# Patient Record
Sex: Male | Born: 1980 | Race: White | Hispanic: No | Marital: Single | Smoking: Current every day smoker
Health system: Southern US, Community
[De-identification: ages and names within clinical notes are randomized; demographics above are authoritative.]

## PROBLEM LIST (undated history)

## (undated) DIAGNOSIS — R12 Heartburn: Secondary | ICD-10-CM

## (undated) HISTORY — DX: Heartburn: R12

---

## 2006-04-29 ENCOUNTER — Emergency Department: Payer: Self-pay | Admitting: Emergency Medicine

## 2006-05-01 ENCOUNTER — Emergency Department: Payer: Self-pay | Admitting: Internal Medicine

## 2006-05-03 ENCOUNTER — Emergency Department: Payer: Self-pay | Admitting: Emergency Medicine

## 2006-05-12 ENCOUNTER — Emergency Department: Payer: Self-pay | Admitting: Internal Medicine

## 2006-10-17 ENCOUNTER — Ambulatory Visit: Payer: Self-pay | Admitting: Family Medicine

## 2006-10-26 ENCOUNTER — Ambulatory Visit: Payer: Self-pay | Admitting: Family Medicine

## 2007-06-28 ENCOUNTER — Emergency Department: Payer: Self-pay | Admitting: Internal Medicine

## 2015-05-20 ENCOUNTER — Encounter: Payer: Self-pay | Admitting: Family Medicine

## 2015-05-20 ENCOUNTER — Ambulatory Visit (INDEPENDENT_AMBULATORY_CARE_PROVIDER_SITE_OTHER): Payer: Commercial Managed Care - HMO | Admitting: Family Medicine

## 2015-05-20 VITALS — BP 123/79 | HR 75 | Temp 98.2°F | Resp 16 | Ht 70.0 in | Wt 382.0 lb

## 2015-05-20 DIAGNOSIS — F17200 Nicotine dependence, unspecified, uncomplicated: Secondary | ICD-10-CM | POA: Insufficient documentation

## 2015-05-20 DIAGNOSIS — E669 Obesity, unspecified: Secondary | ICD-10-CM | POA: Diagnosis not present

## 2015-05-20 DIAGNOSIS — Z720 Tobacco use: Secondary | ICD-10-CM

## 2015-05-20 DIAGNOSIS — Z7689 Persons encountering health services in other specified circumstances: Secondary | ICD-10-CM | POA: Insufficient documentation

## 2015-05-20 DIAGNOSIS — Z7189 Other specified counseling: Secondary | ICD-10-CM | POA: Diagnosis not present

## 2015-05-20 NOTE — Patient Instructions (Signed)
Long discussion re: stopping smoking and weight loss strategies.

## 2015-05-20 NOTE — Progress Notes (Signed)
Name: Timothy Beck   MRN: [id number]    DOB: 30-Sep-1980   Date:05/20/2015       Progress Note  Subjective  Chief Complaint  Chief Complaint  Patient presents with  . Establish Care    HPI Here to establish care.  Has problem with Obesity and he smokes 1 ppd.  He now wants to stop smoking and to lose weight.  He has started a weight loss and diet and exercise program.  He took Chantix in distant past with minimal success.  No problem-specific assessment & plan notes found for this encounter.   Past Medical History  Diagnosis Date  . Heartburn     History reviewed. No pertinent past surgical history.  Family History  Problem Relation Age of Onset  . Diabetes Mother   . Alcohol abuse Father   . Hypertension Daughter     Social History   Social History  . Marital Status: Single    Spouse Name: N/A  . Number of Children: N/A  . Years of Education: N/A   Occupational History  . Not on file.   Social History Main Topics  . Smoking status: Current Every Day Smoker -- 1.00 packs/day for 12 years    Types: Cigarettes  . Smokeless tobacco: Never Used  . Alcohol Use: 2.4 oz/week    4 Cans of beer per week  . Drug Use: No  . Sexual Activity: Not on file   Other Topics Concern  . Not on file   Social History Narrative  . No narrative on file     Current outpatient prescriptions:  Marland Kitchen  Multiple Vitamin (MULTIVITAMIN) tablet, Take 1 tablet by mouth daily., Disp: , Rfl:   Not on File   Review of Systems  Constitutional: Negative for fever, chills, weight loss and malaise/fatigue.  HENT: Negative for hearing loss.   Eyes: Negative for blurred vision and double vision.  Respiratory: Negative for cough, shortness of breath and wheezing.   Cardiovascular: Negative for chest pain, palpitations and leg swelling.  Gastrointestinal: Negative for heartburn, abdominal pain and blood in stool.  Genitourinary: Negative for dysuria, urgency and frequency.  Musculoskeletal:  Negative for myalgias and joint pain.  Skin: Negative for rash.  Neurological: Negative for dizziness, tremors, weakness and headaches.      Objective  Filed Vitals:   05/20/15 1006  BP: 123/79  Pulse: 75  Temp: 98.2 F (36.8 C)  TempSrc: Oral  Resp: 16  Height:  (1.778 m)  Weight: 382 lb (173.274 kg)    Physical Exam  Constitutional: He is oriented to person, place, and time and well-developed, well-nourished, and in no distress. No distress.  HENT:  Head: Normocephalic and atraumatic.  Eyes: Conjunctivae and EOM are normal. Pupils are equal, round, and reactive to light. No scleral icterus.  Neck: Normal range of motion. Neck supple. No thyromegaly present.  Cardiovascular: Normal rate, regular rhythm and normal heart sounds.  Exam reveals no gallop and no friction rub.   No murmur heard. Pulmonary/Chest: Effort normal and breath sounds normal. No respiratory distress. He has no wheezes. He has no rales.  Abdominal: Soft. Bowel sounds are normal. He exhibits no distension and no mass. There is no tenderness.  Morbidly obese  Musculoskeletal: He exhibits edema (bilateral 1+ pedal edema.).  Lymphadenopathy:    He has no cervical adenopathy.  Neurological: He is alert and oriented to person, place, and time.  Vitals reviewed.      No results found for this  or any previous visit (from the past 2160 hour(s)).   Assessment & Plan  Problem List Items Addressed This Visit      Other   Smoker - Primary   Relevant Orders   PR TOBACCO COUNSELING   Obesity   Relevant Orders   Comprehensive Metabolic Panel (CMET)   Lipid Profile   CBC with Differential   TSH   Encounter to establish care      Meds ordered this encounter  Medications  . Multiple Vitamin (MULTIVITAMIN) tablet    Sig: Take 1 tablet by mouth daily.   1. Encounter to establish care   2. Smoker  - PR TOBACCO COUNSELING  3. Obesity  - Comprehensive Metabolic Panel (CMET) - Lipid  Profile - CBC with Differential - TSH

## 2015-06-26 ENCOUNTER — Ambulatory Visit: Payer: Commercial Managed Care - HMO | Attending: Otolaryngology

## 2015-06-26 DIAGNOSIS — G4733 Obstructive sleep apnea (adult) (pediatric): Secondary | ICD-10-CM | POA: Diagnosis not present

## 2015-06-26 DIAGNOSIS — Z6841 Body Mass Index (BMI) 40.0 and over, adult: Secondary | ICD-10-CM | POA: Diagnosis not present

## 2015-06-27 LAB — COMPREHENSIVE METABOLIC PANEL
A/G RATIO: 1.9 (ref 1.2–2.2)
ALBUMIN: 4.3 g/dL (ref 3.5–5.5)
ALT: 28 IU/L (ref 0–44)
AST: 23 IU/L (ref 0–40)
Alkaline Phosphatase: 85 IU/L (ref 39–117)
BILIRUBIN TOTAL: 0.3 mg/dL (ref 0.0–1.2)
BUN / CREAT RATIO: 16 (ref 9–20)
BUN: 15 mg/dL (ref 6–20)
CHLORIDE: 102 mmol/L (ref 96–106)
CO2: 23 mmol/L (ref 18–29)
Calcium: 9.3 mg/dL (ref 8.7–10.2)
Creatinine, Ser: 0.93 mg/dL (ref 0.76–1.27)
GFR calc non Af Amer: 107 mL/min/{1.73_m2} (ref >59)
GFR, EST AFRICAN AMERICAN: 123 mL/min/{1.73_m2} (ref >59)
GLOBULIN, TOTAL: 2.3 g/dL (ref 1.5–4.5)
Glucose: 111 mg/dL — ABNORMAL HIGH (ref 65–99)
Potassium: 5 mmol/L (ref 3.5–5.2)
SODIUM: 141 mmol/L (ref 134–144)
TOTAL PROTEIN: 6.6 g/dL (ref 6.0–8.5)

## 2015-06-27 LAB — CBC WITH DIFFERENTIAL/PLATELET
BASOS ABS: 0 10*3/uL (ref 0.0–0.2)
Basos: 0 %
EOS (ABSOLUTE): 0.3 10*3/uL (ref 0.0–0.4)
Eos: 4 %
HEMOGLOBIN: 14.5 g/dL (ref 12.6–17.7)
Hematocrit: 40.9 % (ref 37.5–51.0)
Immature Grans (Abs): 0 10*3/uL (ref 0.0–0.1)
Immature Granulocytes: 0 %
LYMPHS ABS: 3 10*3/uL (ref 0.7–3.1)
Lymphs: 37 %
MCH: 29.5 pg (ref 26.6–33.0)
MCHC: 35.5 g/dL (ref 31.5–35.7)
MCV: 83 fL (ref 79–97)
MONOCYTES: 7 %
MONOS ABS: 0.5 10*3/uL (ref 0.1–0.9)
Neutrophils Absolute: 4.4 10*3/uL (ref 1.4–7.0)
Neutrophils: 52 %
Platelets: 236 10*3/uL (ref 150–379)
RBC: 4.91 x10E6/uL (ref 4.14–5.80)
RDW: 13.9 % (ref 12.3–15.4)
WBC: 8.3 10*3/uL (ref 3.4–10.8)

## 2015-06-27 LAB — TSH: TSH: 1.61 u[IU]/mL (ref 0.450–4.500)

## 2015-06-27 LAB — LIPID PANEL
Chol/HDL Ratio: 3.4 ratio units (ref 0.0–5.0)
Cholesterol, Total: 153 mg/dL (ref 100–199)
HDL: 45 mg/dL (ref >39)
LDL Calculated: 94 mg/dL (ref 0–99)
Triglycerides: 71 mg/dL (ref 0–149)
VLDL CHOLESTEROL CAL: 14 mg/dL (ref 5–40)

## 2015-06-30 ENCOUNTER — Ambulatory Visit: Payer: Commercial Managed Care - HMO | Admitting: Family Medicine

## 2015-06-30 ENCOUNTER — Encounter: Payer: Self-pay | Admitting: *Deleted

## 2015-07-02 LAB — HGB A1C W/O EAG: HEMOGLOBIN A1C: 5.7 % — AB (ref 4.8–5.6)

## 2015-07-02 LAB — SPECIMEN STATUS REPORT

## 2015-07-23 ENCOUNTER — Ambulatory Visit (INDEPENDENT_AMBULATORY_CARE_PROVIDER_SITE_OTHER): Payer: Commercial Managed Care - HMO | Admitting: Family Medicine

## 2015-07-23 ENCOUNTER — Encounter: Payer: Self-pay | Admitting: Family Medicine

## 2015-07-23 VITALS — BP 118/76 | HR 72 | Temp 98.6°F | Resp 16 | Ht 70.0 in | Wt 392.0 lb

## 2015-07-23 DIAGNOSIS — F17200 Nicotine dependence, unspecified, uncomplicated: Secondary | ICD-10-CM

## 2015-07-23 DIAGNOSIS — J302 Other seasonal allergic rhinitis: Secondary | ICD-10-CM | POA: Insufficient documentation

## 2015-07-23 DIAGNOSIS — I8311 Varicose veins of right lower extremity with inflammation: Secondary | ICD-10-CM

## 2015-07-23 DIAGNOSIS — R7303 Prediabetes: Secondary | ICD-10-CM

## 2015-07-23 DIAGNOSIS — E669 Obesity, unspecified: Secondary | ICD-10-CM | POA: Diagnosis not present

## 2015-07-23 DIAGNOSIS — Z720 Tobacco use: Secondary | ICD-10-CM

## 2015-07-23 DIAGNOSIS — F429 Obsessive-compulsive disorder, unspecified: Secondary | ICD-10-CM | POA: Insufficient documentation

## 2015-07-23 DIAGNOSIS — R600 Localized edema: Secondary | ICD-10-CM | POA: Insufficient documentation

## 2015-07-23 DIAGNOSIS — I872 Venous insufficiency (chronic) (peripheral): Secondary | ICD-10-CM

## 2015-07-23 DIAGNOSIS — I8312 Varicose veins of left lower extremity with inflammation: Secondary | ICD-10-CM | POA: Diagnosis not present

## 2015-07-23 MED ORDER — TRIAMCINOLONE ACETONIDE 0.1 % EX CREA: 1.0000 "application " | TOPICAL_CREAM | Freq: Two times a day (BID) | CUTANEOUS | Status: DC

## 2015-07-23 MED ORDER — BUPROPION HCL ER (SR) 100 MG PO TB12: 100.0000 mg | ORAL_TABLET | Freq: Two times a day (BID) | ORAL | Status: DC

## 2015-07-23 MED ORDER — FLUTICASONE PROPIONATE 50 MCG/ACT NA SUSP
2.0000 | Freq: Every day | NASAL | Status: DC
Start: 2015-07-23 — End: 2019-04-16

## 2015-07-23 MED ORDER — FUROSEMIDE 20 MG PO TABS: 20.0000 mg | ORAL_TABLET | Freq: Every day | ORAL | Status: DC

## 2015-07-23 NOTE — Progress Notes (Signed)
Name: Timothy Beck   MRN: [id number]    DOB: [birth year]   Date:07/23/2015       Progress Note  Subjective  Chief Complaint  Chief Complaint  Patient presents with  . Nicotine Dependence    patient's smoking     HPI Here for f/u of smoking.  Also has been found to be pre-diabetic with A1c of 5.7.  He has had pedal edema.  He is compulsively eating and smoking. He has also continued to gain weight.  No problem-specific assessment & plan notes found for this encounter.   Past Medical History  Diagnosis Date  . Heartburn     History reviewed. No pertinent past surgical history.  Family History  Problem Relation Age of Onset  . Diabetes Mother   . Alcohol abuse Father   . Hypertension Daughter     Social History   Social History  . Marital Status: Single    Spouse Name: N/A  . Number of Children: N/A  . Years of Education: N/A   Occupational History  . Not on file.   Social History Main Topics  . Smoking status: Current Every Day Smoker -- 1.00 packs/day for 12 years    Types: Cigarettes  . Smokeless tobacco: Never Used  . Alcohol Use: 2.4 oz/week    4 Cans of beer per week  . Drug Use: No  . Sexual Activity: Not on file   Other Topics Concern  . Not on file   Social History Narrative     Current outpatient prescriptions:  .  fluticasone (FLONASE) 50 MCG/ACT nasal spray, Place 2 sprays into both nostrils daily., Disp: 16 g, Rfl: 12 .  Multiple Vitamin (MULTIVITAMIN) tablet, Take 1 tablet by mouth daily., Disp: , Rfl:  .  buPROPion (WELLBUTRIN SR) 100 MG 12 hr tablet, Take 1 tablet (100 mg total) by mouth 2 (two) times daily., Disp: 60 tablet, Rfl: 3 .  furosemide (LASIX) 20 MG tablet, Take 1 tablet (20 mg total) by mouth daily., Disp: 30 tablet, Rfl: 1 .  triamcinolone cream (KENALOG) 0.1 %, Apply 1 application topically 2 (two) times daily., Disp: 60 g, Rfl: 3  Not on File   Review of Systems  Constitutional: Negative for fever, chills, weight loss  and malaise/fatigue.  HENT: Negative for hearing loss.   Eyes: Negative for blurred vision and double vision.  Respiratory: Negative for cough, shortness of breath and wheezing.   Cardiovascular: Positive for leg swelling. Negative for chest pain and palpitations.  Gastrointestinal: Negative for heartburn, abdominal pain and blood in stool.  Genitourinary: Negative for dysuria, urgency and frequency.  Musculoskeletal: Negative for myalgias and joint pain.  Skin: Negative for rash.  Neurological: Negative for dizziness, tremors, weakness and headaches.  Psychiatric/Behavioral:       Compulsive behavior.      Objective  Filed Vitals:   07/23/15 1100  BP: 118/76  Pulse: 72  Temp: 98.6 F (37 C)  TempSrc: Oral  Resp: 16  Height:  (1.778 m)  Weight: 392 lb (177.81 kg)    Physical Exam  Constitutional: He is oriented to person, place, and time and well-developed, well-nourished, and in no distress. No distress.  HENT:  Head: Normocephalic and atraumatic.  Eyes: Conjunctivae and EOM are normal. Pupils are equal, round, and reactive to light. No scleral icterus.  Neck: Normal range of motion. Neck supple. Carotid bruit is not present. No thyromegaly present.  Cardiovascular: Normal rate and regular rhythm.  Exam reveals no gallop  and no friction rub.   No murmur heard. Pulmonary/Chest: Effort normal and breath sounds normal. No respiratory distress. He has no wheezes. He has no rales.  Musculoskeletal: He exhibits edema (1+ pedal edema).  Lymphadenopathy:    He has no cervical adenopathy.  Neurological: He is alert and oriented to person, place, and time.  Skin:  excematous rash of both lower legs  Psychiatric:  No overt depression.  No suicidal ideation  Vitals reviewed.      Recent Results (from the past 2160 hour(s))  Comprehensive Metabolic Panel (CMET)     Status: Abnormal   Collection Time: 06/26/15  9:07 AM  Result Value Ref Range   Glucose 111 (H) 65 -  99 mg/dL   BUN 15 6 - 20 mg/dL   Creatinine, Ser 1.610.93 0.76 - 1.27 mg/dL   GFR calc non Af Amer 107 >59 mL/min/1.73   GFR calc Af Amer 123 >59 mL/min/1.73   BUN/Creatinine Ratio 16 9 - 20   Sodium 141 134 - 144 mmol/L   Potassium 5.0 3.5 - 5.2 mmol/L   Chloride 102 96 - 106 mmol/L   CO2 23 18 - 29 mmol/L   Calcium 9.3 8.7 - 10.2 mg/dL   Total Protein 6.6 6.0 - 8.5 g/dL   Albumin 4.3 3.5 - 5.5 g/dL   Globulin, Total 2.3 1.5 - 4.5 g/dL   Albumin/Globulin Ratio 1.9 1.2 - 2.2   Bilirubin Total 0.3 0.0 - 1.2 mg/dL   Alkaline Phosphatase 85 39 - 117 IU/L   AST 23 0 - 40 IU/L   ALT 28 0 - 44 IU/L  Lipid Profile     Status: None   Collection Time: 06/26/15  9:07 AM  Result Value Ref Range   Cholesterol, Total 153 100 - 199 mg/dL   Triglycerides 71 0 - 149 mg/dL   HDL 45 >09>39 mg/dL   VLDL Cholesterol Cal 14 5 - 40 mg/dL   LDL Calculated 94 0 - 99 mg/dL   Chol/HDL Ratio 3.4 0.0 - 5.0 ratio units    Comment:                                   T. Chol/HDL Ratio                                             Men  Women                               1/2 Avg.Risk  3.4    3.3                                   Avg.Risk  5.0    4.4                                2X Avg.Risk  9.6    7.1                                3X Avg.Risk 23.4   11.0   CBC with Differential  Status: None   Collection Time: 06/26/15  9:07 AM  Result Value Ref Range   WBC 8.3 3.4 - 10.8 x10E3/uL   RBC 4.91 4.14 - 5.80 x10E6/uL   Hemoglobin 14.5 12.6 - 17.7 g/dL   Hematocrit 40.9 81.1 - 51.0 %   MCV 83 79 - 97 fL   MCH 29.5 26.6 - 33.0 pg   MCHC 35.5 31.5 - 35.7 g/dL   RDW 91.4 78.2 - 95.6 %   Platelets 236 150 - 379 x10E3/uL   Neutrophils 52 %   Lymphs 37 %   Monocytes 7 %   Eos 4 %   Basos 0 %   Neutrophils Absolute 4.4 1.4 - 7.0 x10E3/uL   Lymphocytes Absolute 3.0 0.7 - 3.1 x10E3/uL   Monocytes Absolute 0.5 0.1 - 0.9 x10E3/uL   EOS (ABSOLUTE) 0.3 0.0 - 0.4 x10E3/uL   Basophils Absolute 0.0 0.0 - 0.2 x10E3/uL    Immature Granulocytes 0 %   Immature Grans (Abs) 0.0 0.0 - 0.1 x10E3/uL  TSH     Status: None   Collection Time: 06/26/15  9:07 AM  Result Value Ref Range   TSH 1.610 0.450 - 4.500 uIU/mL  Hgb A1c w/o eAG     Status: Abnormal   Collection Time: 06/26/15  9:07 AM  Result Value Ref Range   Hgb A1c MFr Bld 5.7 (H) 4.8 - 5.6 %    Comment:          Pre-diabetes: 5.7 - 6.4          Diabetes: >6.4          Glycemic control for adults with diabetes: <7.0   Specimen status report     Status: None   Collection Time: 06/26/15  9:07 AM  Result Value Ref Range   specimen status report Comment     Comment: Written Authorization Written Authorization Written Authorization Received. Authorization received from [facility] 07-01-2015 Logged by Frances Furbish      Assessment & Plan  Problem List Items Addressed This Visit      Respiratory   Seasonal allergies   Relevant Medications   fluticasone (FLONASE) 50 MCG/ACT nasal spray     Other   Smoker   Obesity   Compulsive disorder   Relevant Medications   buPROPion (WELLBUTRIN SR) 100 MG 12 hr tablet   Pedal edema   Relevant Medications   furosemide (LASIX) 20 MG tablet    Other Visit Diagnoses    Pre-diabetes    -  Primary    Venous stasis dermatitis of both lower extremities        Relevant Medications    triamcinolone cream (KENALOG) 0.1 %       Meds ordered this encounter  Medications  . DISCONTD: fluticasone (FLONASE) 50 MCG/ACT nasal spray    Sig: Place 2 sprays into both nostrils daily.    Refill:  12  . fluticasone (FLONASE) 50 MCG/ACT nasal spray    Sig: Place 2 sprays into both nostrils daily.    Dispense:  16 g    Refill:  12  . triamcinolone cream (KENALOG) 0.1 %    Sig: Apply 1 application topically 2 (two) times daily.    Dispense:  60 g    Refill:  3  . furosemide (LASIX) 20 MG tablet    Sig: Take 1 tablet (20 mg total) by mouth daily.    Dispense:  30 tablet    Refill:  1  . buPROPion  [facility] SR)  100 MG 12 hr tablet    Sig: Take 1 tablet (100 mg total) by mouth 2 (two) times daily.    Dispense:  60 tablet    Refill:  3   1. Pre-diabetes   2. Obesity   3. Smoker   4. Compulsive disorder  - buPROPion (WELLBUTRIN SR) 100 MG 12 hr tablet; Take 1 tablet (100 mg total) by mouth 2 (two) times daily.  Dispense: 60 tablet; Refill: 3  5. Seasonal allergies  - fluticasone (FLONASE) 50 MCG/ACT nasal spray; Place 2 sprays into both nostrils daily.  Dispense: 16 g; Refill: 12  6. Venous stasis dermatitis of both lower extremities  - triamcinolone cream (KENALOG) 0.1 %; Apply 1 application topically 2 (two) times daily.  Dispense: 60 g; Refill: 3  7. Pedal edema  - furosemide (LASIX) 20 MG tablet; Take 1 tablet (20 mg total) by mouth daily.  Dispense: 30 tablet; Refill: 1

## 2015-08-25 ENCOUNTER — Ambulatory Visit (INDEPENDENT_AMBULATORY_CARE_PROVIDER_SITE_OTHER): Payer: Commercial Managed Care - HMO | Admitting: Family Medicine

## 2015-08-25 ENCOUNTER — Encounter: Payer: Self-pay | Admitting: Family Medicine

## 2015-08-25 VITALS — BP 121/72 | HR 76 | Temp 99.0°F | Resp 16 | Ht 70.0 in | Wt 389.0 lb

## 2015-08-25 DIAGNOSIS — R600 Localized edema: Secondary | ICD-10-CM | POA: Diagnosis not present

## 2015-08-25 DIAGNOSIS — F429 Obsessive-compulsive disorder, unspecified: Secondary | ICD-10-CM

## 2015-08-25 DIAGNOSIS — F17200 Nicotine dependence, unspecified, uncomplicated: Secondary | ICD-10-CM

## 2015-08-25 DIAGNOSIS — Z720 Tobacco use: Secondary | ICD-10-CM

## 2015-08-25 DIAGNOSIS — E669 Obesity, unspecified: Secondary | ICD-10-CM

## 2015-08-25 LAB — BASIC METABOLIC PANEL WITH GFR
BUN: 16 mg/dL (ref 7–25)
CALCIUM: 9.4 mg/dL (ref 8.6–10.3)
CO2: 27 mmol/L (ref 20–31)
CREATININE: 1.01 mg/dL (ref 0.60–1.35)
Chloride: 103 mmol/L (ref 98–110)
GFR, Est Non African American: >89 mL/min (ref >=60)
GLUCOSE: 99 mg/dL (ref 65–99)
Potassium: 4.6 mmol/L (ref 3.5–5.3)
Sodium: 137 mmol/L (ref 135–146)

## 2015-08-25 MED ORDER — FUROSEMIDE 20 MG PO TABS: 20.0000 mg | ORAL_TABLET | Freq: Every day | ORAL | 6 refills | Status: DC

## 2015-08-25 MED ORDER — BUPROPION HCL ER (SR) 150 MG PO TB12: 150.0000 mg | ORAL_TABLET | Freq: Two times a day (BID) | ORAL | 6 refills | Status: DC

## 2015-08-25 NOTE — Progress Notes (Signed)
Name: Timothy Beck   MRN: [id number]    DOB: 27-Oct-1980   Date:08/25/2015       Progress Note  Subjective  Chief Complaint  Chief Complaint  Patient presents with  . Edema    f/u new start lasix    HPI  Here for f/u of Obesity, Cigarette smoking and Pedal edema.  Still smoking, but only 1 ppd instead of 2.Marland Kitchen  He is noticing some improvement of edema, but takes it intermittantly sometimes. HE is still not eating properly yet. No problem-specific Assessment & Plan notes found for this encounter.   Past Medical History:  Diagnosis Date  . Heartburn     History reviewed. No pertinent surgical history.  Family History  Problem Relation Age of Onset  . Diabetes Mother   . Alcohol abuse Father   . Hypertension Daughter     Social History   Social History  . Marital status: Single    Spouse name: N/A  . Number of children: N/A  . Years of education: N/A   Occupational History  . Not on file.   Social History Main Topics  . Smoking status: Current Every Day Smoker    Packs/day: 1.00    Years: 12.00    Types: Cigarettes  . Smokeless tobacco: Never Used  . Alcohol use 2.4 oz/week    4 Cans of beer per week  . Drug use: No  . Sexual activity: Not on file   Other Topics Concern  . Not on file   Social History Narrative  . No narrative on file     Current Outpatient Prescriptions:  .  buPROPion (WELLBUTRIN SR) 150 MG 12 hr tablet, Take 1 tablet (150 mg total) by mouth 2 (two) times daily., Disp: 60 tablet, Rfl: 6 .  fluticasone (FLONASE) 50 MCG/ACT nasal spray, Place 2 sprays into both nostrils daily., Disp: 16 g, Rfl: 12 .  furosemide (LASIX) 20 MG tablet, Take 1 tablet (20 mg total) by mouth daily., Disp: 30 tablet, Rfl: 6 .  Multiple Vitamin (MULTIVITAMIN) tablet, Take 1 tablet by mouth daily., Disp: , Rfl:  .  triamcinolone cream (KENALOG) 0.1 %, Apply 1 application topically 2 (two) times daily., Disp: 60 g, Rfl: 3  No Known Allergies   Review of Systems   Constitutional: Negative for chills, fever, malaise/fatigue and weight loss.  HENT: Negative for hearing loss.   Eyes: Negative for blurred vision and double vision.  Respiratory: Negative for cough, shortness of breath and wheezing.   Cardiovascular: Negative for chest pain, palpitations and leg swelling.  Gastrointestinal: Negative for abdominal pain, blood in stool and heartburn.  Genitourinary: Negative for dysuria, frequency and urgency.  Musculoskeletal: Negative for back pain and myalgias.  Neurological: Negative for dizziness, tremors, weakness and headaches.      Objective  Vitals:   08/25/15 0934  BP: 121/72  Pulse: 76  Resp: 16  Temp: 99 F (37.2 C)  TempSrc: Oral  Weight: (!) 389 lb (176.4 kg)  Height: 5\' 10"  (1.778 m)    Physical Exam  Constitutional: He is oriented to person, place, and time. No distress.  HENT:  Head: Normocephalic and atraumatic.  Eyes: Conjunctivae and EOM are normal. Pupils are equal, round, and reactive to light. No scleral icterus.  Neck: Normal range of motion. Neck supple. Carotid bruit is not present. No thyromegaly present.  Cardiovascular: Normal rate, regular rhythm and normal heart sounds.  Exam reveals no gallop and no friction rub.   No murmur heard.  Pulmonary/Chest: Effort normal and breath sounds normal. No respiratory distress. He has no wheezes. He has no rales.  Abdominal: Soft. Bowel sounds are normal. He exhibits no distension and no mass. There is no tenderness.  Musculoskeletal: He exhibits edema (1+ bilateral pedal edema).  Lymphadenopathy:    He has no cervical adenopathy.  Neurological: He is alert and oriented to person, place, and time.  Vitals reviewed.      Recent Results (from the past 2160 hour(s))  Comprehensive Metabolic Panel (CMET)     Status: Abnormal   Collection Time: 06/26/15  9:07 AM  Result Value Ref Range   Glucose 111 (H) 65 - 99 mg/dL   BUN 15 6 - 20 mg/dL   Creatinine, Ser 0.98 0.76 -  1.27 mg/dL   GFR calc non Af Amer 107 >59 mL/min/1.73   GFR calc Af Amer 123 >59 mL/min/1.73   BUN/Creatinine Ratio 16 9 - 20   Sodium 141 134 - 144 mmol/L   Potassium 5.0 3.5 - 5.2 mmol/L   Chloride 102 96 - 106 mmol/L   CO2 23 18 - 29 mmol/L   Calcium 9.3 8.7 - 10.2 mg/dL   Total Protein 6.6 6.0 - 8.5 g/dL   Albumin 4.3 3.5 - 5.5 g/dL   Globulin, Total 2.3 1.5 - 4.5 g/dL   Albumin/Globulin Ratio 1.9 1.2 - 2.2   Bilirubin Total 0.3 0.0 - 1.2 mg/dL   Alkaline Phosphatase 85 39 - 117 IU/L   AST 23 0 - 40 IU/L   ALT 28 0 - 44 IU/L  Lipid Profile     Status: None   Collection Time: 06/26/15  9:07 AM  Result Value Ref Range   Cholesterol, Total 153 100 - 199 mg/dL   Triglycerides 71 0 - 149 mg/dL   HDL 45 >11 mg/dL   VLDL Cholesterol Cal 14 5 - 40 mg/dL   LDL Calculated 94 0 - 99 mg/dL   Chol/HDL Ratio 3.4 0.0 - 5.0 ratio units    Comment:                                   T. Chol/HDL Ratio                                             Men  Women                               1/2 Avg.Risk  3.4    3.3                                   Avg.Risk  5.0    4.4                                2X Avg.Risk  9.6    7.1                                3X Avg.Risk 23.4   11.0   CBC with Differential     Status: None   Collection Time:  06/26/15  9:07 AM  Result Value Ref Range   WBC 8.3 3.4 - 10.8 x10E3/uL   RBC 4.91 4.14 - 5.80 x10E6/uL   Hemoglobin 14.5 12.6 - 17.7 g/dL   Hematocrit 02.2 33.6 - 51.0 %   MCV 83 79 - 97 fL   MCH 29.5 26.6 - 33.0 pg   MCHC 35.5 31.5 - 35.7 g/dL   RDW 12.2 44.9 - 75.3 %   Platelets 236 150 - 379 x10E3/uL   Neutrophils 52 %   Lymphs 37 %   Monocytes 7 %   Eos 4 %   Basos 0 %   Neutrophils Absolute 4.4 1.4 - 7.0 x10E3/uL   Lymphocytes Absolute 3.0 0.7 - 3.1 x10E3/uL   Monocytes Absolute 0.5 0.1 - 0.9 x10E3/uL   EOS (ABSOLUTE) 0.3 0.0 - 0.4 x10E3/uL   Basophils Absolute 0.0 0.0 - 0.2 x10E3/uL   Immature Granulocytes 0 %   Immature Grans (Abs) 0.0 0.0 -  0.1 x10E3/uL  TSH     Status: None   Collection Time: 06/26/15  9:07 AM  Result Value Ref Range   TSH 1.610 0.450 - 4.500 uIU/mL  Hgb A1c w/o eAG     Status: Abnormal   Collection Time: 06/26/15  9:07 AM  Result Value Ref Range   Hgb A1c MFr Bld 5.7 (H) 4.8 - 5.6 %    Comment:          Pre-diabetes: 5.7 - 6.4          Diabetes: >6.4          Glycemic control for adults with diabetes: <7.0   Specimen status report     Status: None   Collection Time: 06/26/15  9:07 AM  Result Value Ref Range   specimen status report Comment     Comment: Written Authorization Written Authorization Written Authorization Received. Authorization received from [facility] 07-01-2015 Logged by Frances Furbish      Assessment & Plan  Problem List Items Addressed This Visit      Other   Smoker   Obesity   Compulsive disorder - Primary   Relevant Medications   buPROPion (WELLBUTRIN SR) 150 MG 12 hr tablet   Pedal edema   Relevant Medications   furosemide (LASIX) 20 MG tablet   Other Relevant Orders   BASIC METABOLIC PANEL WITH GFR    Other Visit Diagnoses   None.     Meds ordered this encounter  Medications  . buPROPion (WELLBUTRIN SR) 150 MG 12 hr tablet    Sig: Take 1 tablet (150 mg total) by mouth 2 (two) times daily.    Dispense:  60 tablet    Refill:  6  . furosemide (LASIX) 20 MG tablet    Sig: Take 1 tablet (20 mg total) by mouth daily.    Dispense:  30 tablet    Refill:  6   1. Compulsive disorder  - buPROPion (WELLBUTRIN SR) 150 MG 12 hr tablet; Take 1 tablet (150 mg total) by mouth 2 (two) times daily.  Dispense: 60 tablet; Refill: 6  2. Obesity  Suggest gym activity 3. Pedal edema  - BASIC METABOLIC PANEL WITH GFR - furosemide (LASIX) 20 MG tablet; Take 1 tablet (20 mg total) by mouth daily.  Dispense: 30 tablet; Refill: 6  4. Smoker Discussed cutting back more.

## 2015-09-23 ENCOUNTER — Ambulatory Visit (INDEPENDENT_AMBULATORY_CARE_PROVIDER_SITE_OTHER): Payer: Commercial Managed Care - HMO | Admitting: Family Medicine

## 2015-09-23 ENCOUNTER — Encounter: Payer: Self-pay | Admitting: Family Medicine

## 2015-09-23 VITALS — BP 118/76 | HR 71 | Temp 98.3°F | Resp 16 | Ht 70.0 in | Wt 385.0 lb

## 2015-09-23 DIAGNOSIS — E669 Obesity, unspecified: Secondary | ICD-10-CM | POA: Diagnosis not present

## 2015-09-23 DIAGNOSIS — Z720 Tobacco use: Secondary | ICD-10-CM

## 2015-09-23 DIAGNOSIS — J302 Other seasonal allergic rhinitis: Secondary | ICD-10-CM | POA: Diagnosis not present

## 2015-09-23 DIAGNOSIS — F17200 Nicotine dependence, unspecified, uncomplicated: Secondary | ICD-10-CM

## 2015-09-23 NOTE — Progress Notes (Signed)
Name: Timothy ClaymanJohn Wertheim   MRN: [id number]    DOB: 03/24/1980   Date:09/23/2015       Progress Note  Subjective  Chief Complaint  Chief Complaint  Patient presents with  . Obesity    HPI Here for f/u of morbid obesity.  He has worked on eating better and going to the gym regularly.  He is more motivated and overall happy with his efforts so far.  He is still smoking about 1 ppd (a little less).  Hed is still  Not motivated to stop smoking yet.  No problem-specific Assessment & Plan notes found for this encounter.   Past Medical History:  Diagnosis Date  . Heartburn     History reviewed. No pertinent surgical history.  Family History  Problem Relation Age of Onset  . Diabetes Mother   . Alcohol abuse Father   . Hypertension Daughter     Social History   Social History  . Marital status: Single    Spouse name: N/A  . Number of children: N/A  . Years of education: N/A   Occupational History  . Not on file.   Social History Main Topics  . Smoking status: Current Every Day Smoker    Packs/day: 1.00    Years: 12.00    Types: Cigarettes  . Smokeless tobacco: Never Used  . Alcohol use 2.4 oz/week    4 Cans of beer per week  . Drug use: No  . Sexual activity: Not on file   Other Topics Concern  . Not on file   Social History Narrative  . No narrative on file     Current Outpatient Prescriptions:  .  buPROPion (WELLBUTRIN SR) 150 MG 12 hr tablet, Take 1 tablet (150 mg total) by mouth 2 (two) times daily., Disp: 60 tablet, Rfl: 6 .  fluticasone (FLONASE) 50 MCG/ACT nasal spray, Place 2 sprays into both nostrils daily., Disp: 16 g, Rfl: 12 .  furosemide (LASIX) 20 MG tablet, Take 1 tablet (20 mg total) by mouth daily., Disp: 30 tablet, Rfl: 6 .  Multiple Vitamin (MULTIVITAMIN) tablet, Take 1 tablet by mouth daily., Disp: , Rfl:  .  triamcinolone cream (KENALOG) 0.1 %, Apply 1 application topically 2 (two) times daily., Disp: 60 g, Rfl: 3  Not on File   Review of  Systems  Constitutional: Positive for weight loss (desired). Negative for chills, fever and malaise/fatigue.  HENT: Negative for hearing loss.   Eyes: Negative for blurred vision and double vision.  Respiratory: Negative for cough, shortness of breath and wheezing.   Cardiovascular: Negative for chest pain, palpitations and leg swelling.  Gastrointestinal: Negative for abdominal pain, blood in stool and heartburn.  Genitourinary: Negative for dysuria, frequency and urgency.  Musculoskeletal: Negative for joint pain and myalgias.  Neurological: Negative for dizziness, tremors, weakness and headaches.      Objective  Vitals:   09/23/15 0929  BP: 118/76  Pulse: 71  Resp: 16  Temp: 98.3 F (36.8 C)  TempSrc: Oral  Weight: (!) 385 lb (174.6 kg)  Height: 5\' 10"  (1.778 m)    Physical Exam  Constitutional: He is oriented to person, place, and time and well-developed, well-nourished, and in no distress. No distress.  HENT:  Head: Normocephalic and atraumatic.  Neck: Normal range of motion. Neck supple. Carotid bruit is not present. No thyromegaly present.  Cardiovascular: Normal rate, regular rhythm and normal heart sounds.  Exam reveals no gallop and no friction rub.   No murmur heard. Pulmonary/Chest:  Effort normal and breath sounds normal. No respiratory distress. He has no wheezes. He has no rales.  Abdominal: Soft. Bowel sounds are normal. He exhibits no distension and no mass. There is no tenderness.  Musculoskeletal: He exhibits no edema.  Lymphadenopathy:    He has no cervical adenopathy.  Neurological: He is alert and oriented to person, place, and time.  Vitals reviewed.      Recent Results (from the past 2160 hour(s))  Comprehensive Metabolic Panel (CMET)     Status: Abnormal   Collection Time: 06/26/15  9:07 AM  Result Value Ref Range   Glucose 111 (H) 65 - 99 mg/dL   BUN 15 6 - 20 mg/dL   Creatinine, Ser 1.61 0.76 - 1.27 mg/dL   GFR calc non Af Amer 107 >59  mL/min/1.73   GFR calc Af Amer 123 >59 mL/min/1.73   BUN/Creatinine Ratio 16 9 - 20   Sodium 141 134 - 144 mmol/L   Potassium 5.0 3.5 - 5.2 mmol/L   Chloride 102 96 - 106 mmol/L   CO2 23 18 - 29 mmol/L   Calcium 9.3 8.7 - 10.2 mg/dL   Total Protein 6.6 6.0 - 8.5 g/dL   Albumin 4.3 3.5 - 5.5 g/dL   Globulin, Total 2.3 1.5 - 4.5 g/dL   Albumin/Globulin Ratio 1.9 1.2 - 2.2   Bilirubin Total 0.3 0.0 - 1.2 mg/dL   Alkaline Phosphatase 85 39 - 117 IU/L   AST 23 0 - 40 IU/L   ALT 28 0 - 44 IU/L  Lipid Profile     Status: None   Collection Time: 06/26/15  9:07 AM  Result Value Ref Range   Cholesterol, Total 153 100 - 199 mg/dL   Triglycerides 71 0 - 149 mg/dL   HDL 45 >09 mg/dL   VLDL Cholesterol Cal 14 5 - 40 mg/dL   LDL Calculated 94 0 - 99 mg/dL   Chol/HDL Ratio 3.4 0.0 - 5.0 ratio units    Comment:                                   T. Chol/HDL Ratio                                             Men  Women                               1/2 Avg.Risk  3.4    3.3                                   Avg.Risk  5.0    4.4                                2X Avg.Risk  9.6    7.1                                3X Avg.Risk 23.4   11.0   CBC with Differential     Status: None   Collection Time: 06/26/15  9:07 AM  Result Value Ref Range   WBC 8.3 3.4 - 10.8 x10E3/uL   RBC 4.91 4.14 - 5.80 x10E6/uL   Hemoglobin 14.5 12.6 - 17.7 g/dL   Hematocrit 69.6 29.5 - 51.0 %   MCV 83 79 - 97 fL   MCH 29.5 26.6 - 33.0 pg   MCHC 35.5 31.5 - 35.7 g/dL   RDW 28.4 13.2 - 44.0 %   Platelets 236 150 - 379 x10E3/uL   Neutrophils 52 %   Lymphs 37 %   Monocytes 7 %   Eos 4 %   Basos 0 %   Neutrophils Absolute 4.4 1.4 - 7.0 x10E3/uL   Lymphocytes Absolute 3.0 0.7 - 3.1 x10E3/uL   Monocytes Absolute 0.5 0.1 - 0.9 x10E3/uL   EOS (ABSOLUTE) 0.3 0.0 - 0.4 x10E3/uL   Basophils Absolute 0.0 0.0 - 0.2 x10E3/uL   Immature Granulocytes 0 %   Immature Grans (Abs) 0.0 0.0 - 0.1 x10E3/uL  TSH     Status: None    Collection Time: 06/26/15  9:07 AM  Result Value Ref Range   TSH 1.610 0.450 - 4.500 uIU/mL  Hgb A1c w/o eAG     Status: Abnormal   Collection Time: 06/26/15  9:07 AM  Result Value Ref Range   Hgb A1c MFr Bld 5.7 (H) 4.8 - 5.6 %    Comment:          Pre-diabetes: 5.7 - 6.4          Diabetes: >6.4          Glycemic control for adults with diabetes: <7.0   Specimen status report     Status: None   Collection Time: 06/26/15  9:07 AM  Result Value Ref Range   specimen status report Comment     Comment: Written Authorization Written Authorization Written Authorization Received. Authorization received from [facility] 07-01-2015 Logged by Frances Furbish   BASIC METABOLIC PANEL WITH GFR     Status: None   Collection Time: 08/25/15 11:28 AM  Result Value Ref Range   Sodium 137 135 - 146 mmol/L   Potassium 4.6 3.5 - 5.3 mmol/L   Chloride 103 98 - 110 mmol/L   CO2 27 20 - 31 mmol/L   Glucose, Bld 99 65 - 99 mg/dL   BUN 16 7 - 25 mg/dL   Creat 1.02 7.25 - 3.66 mg/dL   Calcium 9.4 8.6 - 44.0 mg/dL   GFR, Est African American >89 >=60 mL/min   GFR, Est Non African American >89 >=60 mL/min     Assessment & Plan  Problem List Items Addressed This Visit      Respiratory   Seasonal allergies     Other   Smoker   Obesity - Primary    Other Visit Diagnoses   None.     No orders of the defined types were placed in this encounter.  1. Obesity Cont. Diet and exercise  2. Smoker Discussed ways to stop smoking agin  3. Seasonal allergies

## 2015-11-25 ENCOUNTER — Ambulatory Visit: Payer: Commercial Managed Care - HMO | Admitting: Family Medicine

## 2015-12-02 ENCOUNTER — Encounter: Payer: Self-pay | Admitting: Family Medicine

## 2015-12-02 ENCOUNTER — Ambulatory Visit (INDEPENDENT_AMBULATORY_CARE_PROVIDER_SITE_OTHER): Payer: Commercial Managed Care - HMO | Admitting: Family Medicine

## 2015-12-02 VITALS — BP 120/80 | HR 78 | Temp 97.8°F | Resp 16 | Ht 70.0 in | Wt 378.0 lb

## 2015-12-02 DIAGNOSIS — Z23 Encounter for immunization: Secondary | ICD-10-CM

## 2015-12-02 DIAGNOSIS — F17200 Nicotine dependence, unspecified, uncomplicated: Secondary | ICD-10-CM | POA: Diagnosis not present

## 2015-12-02 DIAGNOSIS — IMO0001 Reserved for inherently not codable concepts without codable children: Secondary | ICD-10-CM

## 2015-12-02 DIAGNOSIS — E6609 Other obesity due to excess calories: Secondary | ICD-10-CM

## 2015-12-02 NOTE — Progress Notes (Signed)
Name: Timothy Beck   MRN: [id number]    DOB: 02/19/1980   Date:12/02/2015       Progress Note  Subjective  Chief Complaint  Chief Complaint  Patient presents with  . Follow-up  . Nicotine Dependence  . Obesity    HPI Here for f/u of obesity and smoking cessation.  He has lost a little weight.   He is still smoking.  He has gone back to smoking 1ppd.  He states that he will stop cold [place] on Jan. 1, 2018.  No problem-specific Assessment & Plan notes found for this encounter.   Past Medical History:  Diagnosis Date  . Heartburn     History reviewed. No pertinent surgical history.  Family History  Problem Relation Age of Onset  . Diabetes Mother   . Alcohol abuse Father   . Hypertension Daughter     Social History   Social History  . Marital status: Single    Spouse name: N/A  . Number of children: N/A  . Years of education: N/A   Occupational History  . Not on file.   Social History Main Topics  . Smoking status: Current Every Day Smoker    Packs/day: 1.00    Years: 12.00    Types: Cigarettes  . Smokeless tobacco: Never Used  . Alcohol use 2.4 oz/week    4 Cans of beer per week  . Drug use: No  . Sexual activity: Not on file   Other Topics Concern  . Not on file   Social History Narrative  . No narrative on file     Current Outpatient Prescriptions:  .  buPROPion (WELLBUTRIN SR) 150 MG 12 hr tablet, Take 1 tablet (150 mg total) by mouth 2 (two) times daily., Disp: 60 tablet, Rfl: 6 .  fluticasone (FLONASE) 50 MCG/ACT nasal spray, Place 2 sprays into both nostrils daily., Disp: 16 g, Rfl: 12 .  furosemide (LASIX) 20 MG tablet, Take 1 tablet (20 mg total) by mouth daily., Disp: 30 tablet, Rfl: 6 .  Multiple Vitamin (MULTIVITAMIN) tablet, Take 1 tablet by mouth daily as needed. , Disp: , Rfl:  .  triamcinolone cream (KENALOG) 0.1 %, Apply 1 application topically 2 (two) times daily. (Patient taking differently: Apply 1 application topically 2 (two)  times daily as needed. ), Disp: 60 g, Rfl: 3  Not on File   Review of Systems  Constitutional: Negative for chills, fever, malaise/fatigue and weight loss.  HENT: Negative for hearing loss.   Eyes: Negative for blurred vision and double vision.  Respiratory: Negative for cough, shortness of breath and wheezing.   Cardiovascular: Negative for chest pain, palpitations and leg swelling.  Gastrointestinal: Negative for abdominal pain, blood in stool and heartburn.  Genitourinary: Negative for dysuria, frequency and urgency.  Musculoskeletal: Negative for joint pain and myalgias.  Skin: Negative for rash.  Neurological: Negative for dizziness, tingling, tremors, weakness and headaches.      Objective  Vitals:   12/02/15 1026  BP: 120/80  Pulse: 78  Resp: 16  Temp: 97.8 F (36.6 C)  TempSrc: Oral  Weight: (!) 171.5 kg (378 lb)  Height: 5\' 10"  (1.778 m)    Physical Exam  Constitutional: He is oriented to person, place, and time and well-developed, well-nourished, and in no distress. No distress.  HENT:  Head: Normocephalic and atraumatic.  Eyes: Conjunctivae and EOM are normal. Pupils are equal, round, and reactive to light. No scleral icterus.  Neck: Normal range of motion. Neck supple.  Carotid bruit is not present. No thyromegaly present.  Cardiovascular: Normal rate, regular rhythm and normal heart sounds.  Exam reveals no gallop and no friction rub.   No murmur heard. Pulmonary/Chest: Effort normal and breath sounds normal. No respiratory distress. He has no wheezes. He has no rales.  Abdominal:  obese  Musculoskeletal: He exhibits edema (trace bilateral pedal edema.).  Lymphadenopathy:    He has no cervical adenopathy.  Neurological: He is alert and oriented to person, place, and time.  Vitals reviewed.      No results found for this or any previous visit (from the past 2160 hour(s)).   Assessment & Plan  Problem List Items Addressed This Visit      Other    Smoker   Obesity - Primary    Other Visit Diagnoses    Immunization due       Relevant Orders   Flu Vaccine QUAD 36+ mos IM (Fluarix & Fluzone Quad PF      No orders of the defined types were placed in this encounter. 1. Class 3 obesity due to excess calories without serious comorbidity in adult, unspecified BMI Discussed further weight loss 2. Smoker He plans to quit 01/26/16.  3. Immunization due  - Flu Vaccine QUAD 36+ mos IM (Fluarix & Fluzone Quad PF

## 2016-10-01 ENCOUNTER — Encounter: Payer: 59 | Admitting: Family Medicine

## 2016-12-02 ENCOUNTER — Other Ambulatory Visit: Payer: Self-pay | Admitting: Family

## 2017-05-29 ENCOUNTER — Encounter: Payer: Self-pay | Admitting: Emergency Medicine

## 2017-05-29 ENCOUNTER — Emergency Department: Payer: BLUE CROSS/BLUE SHIELD

## 2017-05-29 ENCOUNTER — Other Ambulatory Visit: Payer: Self-pay

## 2017-05-29 ENCOUNTER — Emergency Department
Admission: EM | Admit: 2017-05-29 | Discharge: 2017-05-29 | Disposition: A | Discharge status: Home / Self Care | Payer: BLUE CROSS/BLUE SHIELD | Attending: Emergency Medicine | Admitting: Emergency Medicine

## 2017-05-29 DIAGNOSIS — R070 Pain in throat: Secondary | ICD-10-CM | POA: Diagnosis present

## 2017-05-29 DIAGNOSIS — G4730 Sleep apnea, unspecified: Secondary | ICD-10-CM | POA: Insufficient documentation

## 2017-05-29 DIAGNOSIS — F17210 Nicotine dependence, cigarettes, uncomplicated: Secondary | ICD-10-CM | POA: Diagnosis not present

## 2017-05-29 DIAGNOSIS — Z79899 Other long term (current) drug therapy: Secondary | ICD-10-CM | POA: Insufficient documentation

## 2017-05-29 DIAGNOSIS — J039 Acute tonsillitis, unspecified: Secondary | ICD-10-CM | POA: Insufficient documentation

## 2017-05-29 DIAGNOSIS — R509 Fever, unspecified: Secondary | ICD-10-CM | POA: Insufficient documentation

## 2017-05-29 LAB — CBC WITH DIFFERENTIAL/PLATELET
Basophils Absolute: 0 10*3/uL (ref 0–0.1)
Basophils Relative: 0 %
EOS PCT: 0 %
Eosinophils Absolute: 0 10*3/uL (ref 0–0.7)
HCT: 39.8 % — ABNORMAL LOW (ref 40.0–52.0)
Hemoglobin: 13.9 g/dL (ref 13.0–18.0)
LYMPHS ABS: 1.4 10*3/uL (ref 1.0–3.6)
LYMPHS PCT: 16 %
MCH: 29.1 pg (ref 26.0–34.0)
MCHC: 35 g/dL (ref 32.0–36.0)
MCV: 83.4 fL (ref 80.0–100.0)
MONOS PCT: 10 %
Monocytes Absolute: 0.8 10*3/uL (ref 0.2–1.0)
Neutro Abs: 6.5 10*3/uL (ref 1.4–6.5)
Neutrophils Relative %: 74 %
PLATELETS: 202 10*3/uL (ref 150–440)
RBC: 4.78 MIL/uL (ref 4.40–5.90)
RDW: 15 % — ABNORMAL HIGH (ref 11.5–14.5)
WBC: 8.8 10*3/uL (ref 3.8–10.6)

## 2017-05-29 LAB — COMPREHENSIVE METABOLIC PANEL
ALBUMIN: 4 g/dL (ref 3.5–5.0)
ALT: 27 U/L (ref 17–63)
AST: 28 U/L (ref 15–41)
Alkaline Phosphatase: 64 U/L (ref 38–126)
Anion gap: 10 (ref 5–15)
BUN: 16 mg/dL (ref 6–20)
CHLORIDE: 99 mmol/L — AB (ref 101–111)
CO2: 25 mmol/L (ref 22–32)
Calcium: 8.6 mg/dL — ABNORMAL LOW (ref 8.9–10.3)
Creatinine, Ser: 0.98 mg/dL (ref 0.61–1.24)
GFR calc Af Amer: >60 mL/min (ref >60)
GFR calc non Af Amer: >60 mL/min (ref >60)
GLUCOSE: 104 mg/dL — AB (ref 65–99)
POTASSIUM: 4 mmol/L (ref 3.5–5.1)
SODIUM: 134 mmol/L — AB (ref 135–145)
Total Bilirubin: 0.7 mg/dL (ref 0.3–1.2)
Total Protein: 7.7 g/dL (ref 6.5–8.1)

## 2017-05-29 LAB — GROUP A STREP BY PCR: Group A Strep by PCR: NOT DETECTED

## 2017-05-29 MED ORDER — IOPAMIDOL (ISOVUE-370) INJECTION 76%
75.0000 mL | Freq: Once | INTRAVENOUS | Status: AC | PRN
  Administered 2017-05-29: 75 mL via INTRAVENOUS
  Filled 2017-05-29: qty 75

## 2017-05-29 MED ORDER — AMOXICILLIN-POT CLAVULANATE 875-125 MG PO TABS: 1.0000 | ORAL_TABLET | Freq: Two times a day (BID) | ORAL | 0 refills | Status: AC

## 2017-05-29 MED ORDER — DEXAMETHASONE SODIUM PHOSPHATE 10 MG/ML IJ SOLN
10.0000 mg | Freq: Once | INTRAMUSCULAR | Status: AC
  Administered 2017-05-29: 10 mg via INTRAVENOUS
  Filled 2017-05-29: qty 1

## 2017-05-29 MED ORDER — SODIUM CHLORIDE 0.9 % IV SOLN
3.0000 g | Freq: Once | INTRAVENOUS | Status: AC
  Administered 2017-05-29: 3 g via INTRAVENOUS
  Filled 2017-05-29: qty 3

## 2017-05-29 MED ORDER — PREDNISONE 10 MG PO TABS: ORAL_TABLET | ORAL | 0 refills | Status: DC

## 2017-05-29 NOTE — ED Notes (Signed)
ED Provider at bedside. 

## 2017-05-29 NOTE — ED Triage Notes (Signed)
Pt here for possible PTA from [facility].  Difficult to assess throat.  Uvula appears swollen but not deviated.  Redness noted. Tonsils enlarged. Pt denies difficulty swallowing and reports can swallow secretions but does not want to because it hurts. Denies any difficulty breathing.  Pt states "I only spit sometimes because it hurts to swallow not because I physically can't, they didn't even talk to me just brought me over".

## 2017-05-29 NOTE — Discharge Instructions (Signed)
As we discussed, your lab work and CT scan were reassuring.  There is no drainable infection right now but you do need to be on antibiotics and steroids.  Please continue using your CPAP.  Stay hydrated with soft foods and liquids.  Take over-the-counter pain medicine such as ibuprofen and Tylenol according to label instructions as needed.  Return immediately to the emergency department if any of your symptoms start to get worse or you have more difficulty breathing or swallowing.

## 2017-05-29 NOTE — ED Notes (Signed)
Patient transported to CT 

## 2017-05-29 NOTE — ED Provider Notes (Signed)
[facility] Emergency Department Provider Note  ____________________________________________   First MD Initiated Contact with Patient 05/29/17 1729     (approximate)  I have reviewed the triage vital signs and the nursing notes.   HISTORY  Chief Complaint Sore Throat    HPI Timothy Beck is a 37 y.o. male with no contributory medical history who presents at the strong recommendation of the urgent care for evaluation of his severe sore throat which is been gradually worsening over the last 3 days.  Nothing particular makes it better.  It feels fine as long as he is not speaking or swallowing, but he has severe pain that is worse on the left side when he does try to swallow.  He is not drooling and he is handling his secretions but he does not want to eat or drink due to the pain.  He has an extremely muffled "hot potato" voice that is atypical for him.  He has no trouble breathing.  He had a fever yesterday but has not felt one today.  He denies nausea, vomiting, chest pain, shortness of breath, abdominal pain, and dysuria.  He has not had symptoms like this before.  They are severe, and gradual in onset.  Past Medical History:  Diagnosis Date  . Heartburn     Patient Active Problem List   Diagnosis Date Noted  . Compulsive disorder 07/23/2015  . Seasonal allergies 07/23/2015  . Pedal edema 07/23/2015  . Smoker 05/20/2015  . Obesity 05/20/2015  . Encounter to establish care 05/20/2015    History reviewed. No pertinent surgical history.  Prior to Admission medications   Medication Sig Start Date End Date Taking? Authorizing Provider  amoxicillin-clavulanate (AUGMENTIN) 875-125 MG tablet Take 1 tablet by mouth every 12 (twelve) hours for 10 days. 05/29/17 06/08/17  Loleta Rose, MD  buPROPion (WELLBUTRIN SR) 150 MG 12 hr tablet Take 1 tablet (150 mg total) by mouth 2 (two) times daily. 08/25/15   Janeann Forehand., MD  fluticasone (FLONASE) 50  MCG/ACT nasal spray Place 2 sprays into both nostrils daily. 07/23/15   Janeann Forehand., MD  furosemide (LASIX) 20 MG tablet Take 1 tablet (20 mg total) by mouth daily. 08/25/15   Janeann Forehand., MD  Multiple Vitamin (MULTIVITAMIN) tablet Take 1 tablet by mouth daily as needed.     [provider]  predniSONE (DELTASONE) 10 MG tablet Take 6 tabs (60 mg) PO x 3 days, then take 4 tabs (40 mg) PO x 3 days, then take 2 tabs (20 mg) PO x 3 days, then take 1 tab (10 mg) PO x 3 days, then take 1/2 tab (5 mg) PO x 4 days. 05/29/17   Loleta Rose, MD  triamcinolone cream (KENALOG) 0.1 % Apply 1 application topically 2 (two) times daily. Patient taking differently: Apply 1 application topically 2 (two) times daily as needed.  07/23/15   Janeann Forehand., MD    Allergies Patient has no known allergies.  Family History  Problem Relation Age of Onset  . Diabetes Mother   . Alcohol abuse Father   . Hypertension Daughter     Social History Social History   Tobacco Use  . Smoking status: Current Every Day Smoker    Packs/day: 1.00    Years: 12.00    Pack years: 12.00    Types: Cigarettes  . Smokeless tobacco: Never Used  Substance Use Topics  . Alcohol use: Yes    Alcohol/week:  2.4 oz    Types: 4 Cans of beer per week  . Drug use: No    Review of Systems Constitutional: Fever yesterday  Eyes: No visual changes. ENT: Severe sore throat gradually worsening for 3 days as described above Cardiovascular: Denies chest pain. Respiratory: Denies shortness of breath. Gastrointestinal: No abdominal pain.  No nausea, no vomiting.  No diarrhea.  No constipation. Genitourinary: Negative for dysuria. Musculoskeletal: Negative for neck pain.  Negative for back pain. Integumentary: Negative for rash. Neurological: Negative for headaches, focal weakness or numbness.   ____________________________________________   PHYSICAL EXAM:  VITAL SIGNS: ED Triage Vitals  Enc Vitals  Group     BP 05/29/17 1559 132/74     Pulse Rate 05/29/17 1559 83     Resp 05/29/17 1559 20     Temp 05/29/17 1559 99.1 F (37.3 C)     Temp Source 05/29/17 1559 Oral     SpO2 05/29/17 1559 97 %     Weight 05/29/17 1557 (!) 163.3 kg (360 lb)     Height 05/29/17 1557 1.778 m ( )     Head Circumference --      Peak Flow --      Pain Score 05/29/17 1557 8     Pain Loc --      Pain Edu? --      Excl. in GC? --     Constitutional: Alert and oriented.  Generally well-appearing and in no acute distress Eyes: Conjunctivae are normal.  Head: Atraumatic. Nose: No congestion/rhinnorhea. Mouth/Throat: Mucous membranes are moist.  Posterior oropharynx is significantly swollen.  There is no obvious uvular deviation but the uvula seems to be resting on the back of his tongue due to the swelling.  There is more swelling and erythema on the left consistent with a peritonsillar abscess. Neck: No stridor.  No meningeal signs.  There is no tenderness to palpation below his mandible on either side, possibly some brawny induration on the left but difficult to assess.  Very muffled and "hot potato" voice.  Tolerating secretions, no drooling, no tripoding Cardiovascular: Normal rate, regular rhythm. Good peripheral circulation. Grossly normal heart sounds. Respiratory: Normal respiratory effort.  No retractions. Lungs CTAB. Gastrointestinal: Soft and nontender. No distention.  Musculoskeletal: No lower extremity tenderness nor edema. No gross deformities of extremities. Neurologic:  Normal speech and language. No gross focal neurologic deficits are appreciated.  Skin:  Skin is warm, dry and intact. No rash noted. Psychiatric: Mood and affect are normal. Speech and behavior are normal.  ____________________________________________   LABS (all labs ordered are listed, but only abnormal results are displayed)  Labs Reviewed  COMPREHENSIVE METABOLIC PANEL - Abnormal; Notable for the following  components:      Result Value   Sodium 134 (*)    Chloride 99 (*)    Glucose, Bld 104 (*)    Calcium 8.6 (*)    All other components within normal limits  CBC WITH DIFFERENTIAL/PLATELET - Abnormal; Notable for the following components:   HCT 39.8 (*)    RDW 15.0 (*)    All other components within normal limits  GROUP A STREP BY PCR   ____________________________________________  EKG  No indication for EKG ____________________________________________  RADIOLOGY I, Loleta Rose, personally viewed and evaluated these images (plain radiographs) as part of my medical decision making, as well as reviewing the written report by the radiologist.  ED MD interpretation: Tonsillitis without any evidence of phlegmon or abscess  Official radiology report(s): Ct  Soft Tissue Neck W Contrast  Result Date: 05/29/2017 CLINICAL DATA:  Initial evaluation for acute throat pain with swelling for 3 days. EXAM: CT NECK WITH CONTRAST TECHNIQUE: Multidetector CT imaging of the neck was performed using the standard protocol following the bolus administration of intravenous contrast. CONTRAST:  75mL ISOVUE-370 IOPAMIDOL (ISOVUE-370) INJECTION 76% COMPARISON:  None. FINDINGS: Pharynx and larynx: Oral cavity within normal limits without mass lesion or loculated fluid collection. No acute abnormality about the dentition. Palatine tonsils are symmetrically enlarged bilaterally, suggesting possible acute tonsillitis. Mild induration within the adjacent parapharyngeal fat, slightly more prominent on the left. Superimposed calcified tonsilliths noted bilaterally. No discrete tonsillar or peritonsillar abscess. Adenoidal soft tissues prominent as well. Nasopharynx and oropharynx otherwise within normal limits. No retropharyngeal collection. No significant epiglottic enlargement. Lingual tonsils largely filled the vallecula. Remainder of the hypopharynx and supraglottic larynx within normal limits. True cords apposed and not  well evaluated. Subglottic airway clear. Salivary glands: Salivary glands including the parotid and submandibular glands are within normal limits. Thyroid: Thyroid normal. Lymph nodes: Enlarged bilateral level II lymph nodes measure up to 17 mm on the left and 14 mm on the right. Additional prominent level III nodes measure up to 14 mm on the right and 10 mm on the left. Findings most likely reactive. Vascular: Normal intravascular enhancement seen throughout the neck. Limited intracranial: Unremarkable. Visualized orbits: Visualized globes and orbital soft tissues within normal limits. Mastoids and visualized paranasal sinuses: Mild mucosal thickening within the maxillary sinuses. Visualized paranasal sinuses are otherwise clear. Visualize mastoids and middle ear cavities are clear. Skeleton: No acute osseous abnormality. No worrisome lytic or blastic osseous lesions. Upper chest: Visualized upper chest within normal limits. Visualized lungs are grossly clear. Subcentimeter calcified granuloma noted at the posterior right upper lung. Other: None. IMPRESSION: 1. Diffuse prominence of the palatine tonsils and adenoidal soft tissues, suggesting acute tonsillitis. No discrete tonsillar or peritonsillar abscess identified. 2. Enlarged bilateral level II-III lymph nodes as above, likely reactive. Electronically Signed   By: Rise Mu M.D.   On: 05/29/2017 19:12    ____________________________________________   PROCEDURES  Critical Care performed: No   Procedure(s) performed:   Procedures   ____________________________________________   INITIAL IMPRESSION / ASSESSMENT AND PLAN / ED COURSE  As part of my medical decision making, I reviewed the following data within the electronic MEDICAL RECORD NUMBER Nursing notes reviewed and incorporated, Labs reviewed , Old chart reviewed, A phone consult was requested and obtained from this/these consultant(s) ENT and Notes from prior ED visits (Dr.  Andee Poles)    Differential diagnosis includes, but is not limited to, peritonsillar abscess, retropharyngeal abscess, epiglottitis, tonsillitis/pharyngitis, Ludwig's angina.  The patient was not having dental problems prior to the onset and the location of the swelling is much more consistent with peritonsillar abscess.  Given the amount of swelling and the degree of pain when he swallows as well as the muffled voice, I think that he may benefit from imaging to assess the extent of the infection and to make sure there is a pocket of infection and not just a developing phlegmon.  I am placing an IV and ordering Unasyn 3 g IV and Decadron 10 mg IV to help with the swelling.  I will consult ENT by phone and asked for their advice and evaluation of the patient and their input as to whether or not a CT scan of the neck would be appropriate or if they want to evaluate him clinically first.  There is no sign of sepsis at this point with normal vital signs and no leukocytosis.   comprehensive metabolic panel is reassuring and strep test is negative by PCR.  Patient agrees with the plan.  Clinical Course as of May 30 1998  Sun May 29, 2017  1739 I spoke by phone with Dr. Andee Poles ENT and described the clinical scenario.  After we discussed it for but he recommended that we do go ahead and obtain a CT scan of the neck and he agreed with my plan for Unasyn and Decadron.  After the infection has been better evaluated with the CT scan, he will review the CT scan and likely come to the ED to evaluate the patient in person as well.   [CF]  1956 The patient feels better, he says significantly better than before.  His voice is a little bit more clear than it was previously and he is able to tolerate fluids.  His CT scan does not indicate any fluid collection, abscess, phlegmon, etc.  It appears to be acute tonsillitis with large tonsils.  The patient does have a history of severe sleep apnea but he promises that he uses a CPAP  every night.  I discussed the case by phone with Dr. Andee Poles again after the CT scan came back and he and I are both comfortable with the plan for discharge and close outpatient follow-up.  The patient is already an established patient of Dr. Mikey Bussing so he will follow-up either with Dr. Jenne Campus or Dr. Andee Poles within 1 to 2 days.  I gave strict return precautions if his symptoms worsen.  I am discharging him on Augmentin and a prednisone taper.  He understands and agrees with the plan and is in agreement.   [CF]    Clinical Course User Index [CF] Loleta Rose, MD    ____________________________________________  FINAL CLINICAL IMPRESSION(S) / ED DIAGNOSES  Final diagnoses:  Acute tonsillitis, unspecified etiology  Sleep apnea, unspecified type     MEDICATIONS GIVEN DURING THIS VISIT:  Medications  Ampicillin-Sulbactam (UNASYN) 3 g in sodium chloride 0.9 % 100 mL IVPB (0 g Intravenous Stopped 05/29/17 1845)  dexamethasone (DECADRON) injection 10 mg (10 mg Intravenous Given 05/29/17 1755)  iopamidol (ISOVUE-370) 76 % injection 75 mL (75 mLs Intravenous Contrast Given 05/29/17 1813)     ED Discharge Orders        Ordered    amoxicillin-clavulanate (AUGMENTIN) 875-125 MG tablet  Every 12 hours     05/29/17 1959    predniSONE (DELTASONE) 10 MG tablet     05/29/17 1959       Note:  This document was prepared using Dragon voice recognition software and may include unintentional dictation errors.    Loleta Rose, MD 05/29/17 2000

## 2019-01-17 IMAGING — CT CT NECK W/ CM
4 of 5 series · 14 of 33 positions shown, 16 images · IV contrast (iopamidol)
Comparison: None.

CLINICAL DATA: Initial evaluation for acute throat pain with
swelling for 3 days.

EXAM:
CT NECK WITH CONTRAST
TECHNIQUE: Multidetector CT imaging of the neck was performed using the
standard protocol following the bolus administration of intravenous
contrast.
CONTRAST:  75mL ZAAE5C-LPX IOPAMIDOL (ZAAE5C-LPX) INJECTION 76%

[Series 2: axial neck · axial · 0.75mm/px · z∈[-260,-200]mm · 2 of 120 slices shown]
[im 30/120  bone]
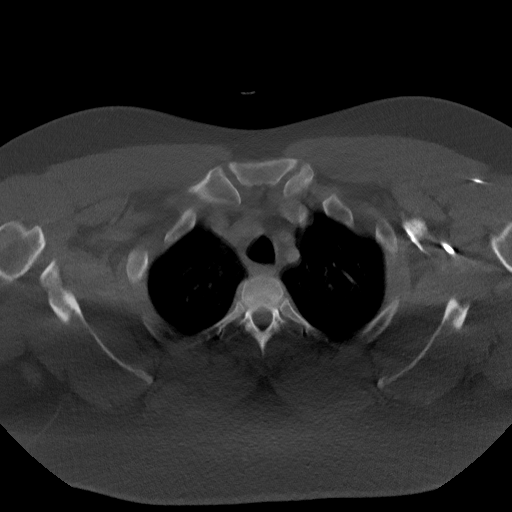
[im 60/120  bone]
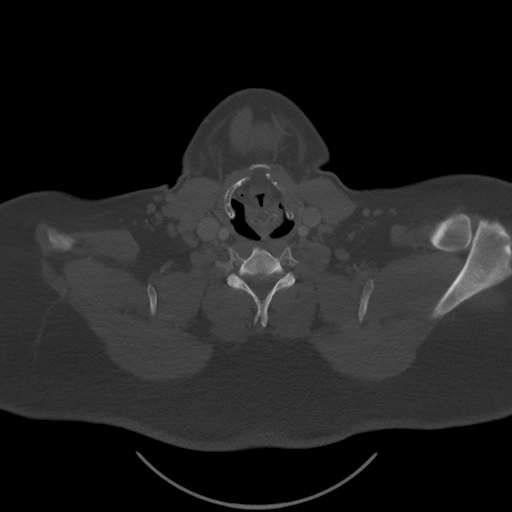

[Series 6: sag neck · sagittal · 0.49mm/px · 5 of 145 slices shown, 6 images]
[im 49/145  bone]
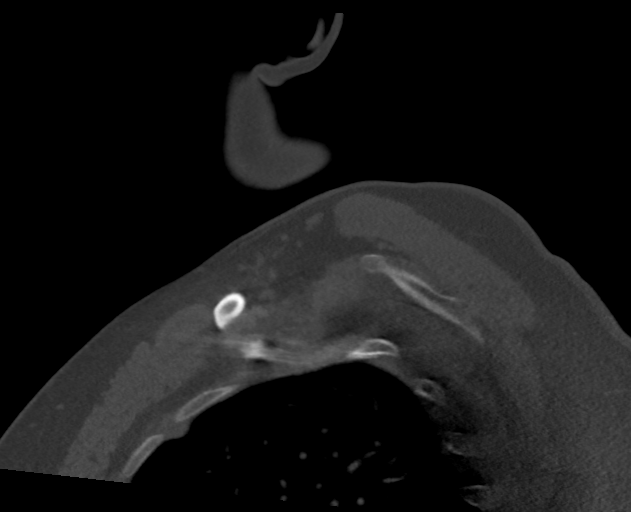
[im 61/145  bone]
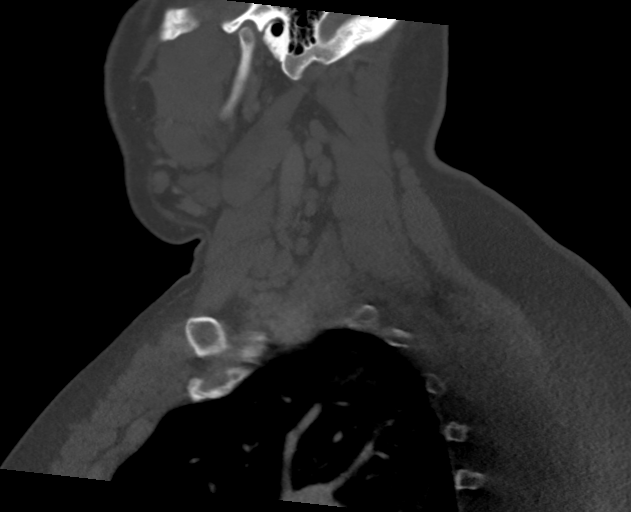
[im 73/145  soft-tissue]
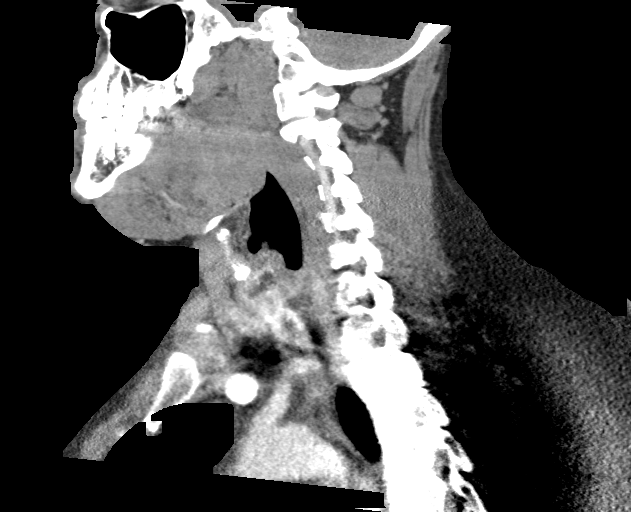
[im 73/145  bone]
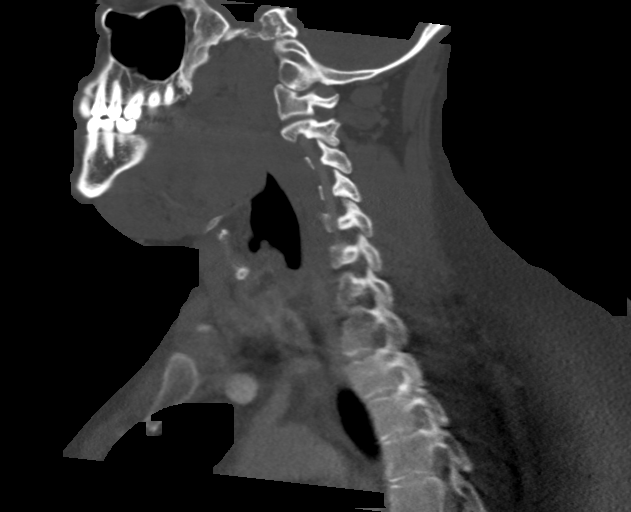
[im 85/145  bone]
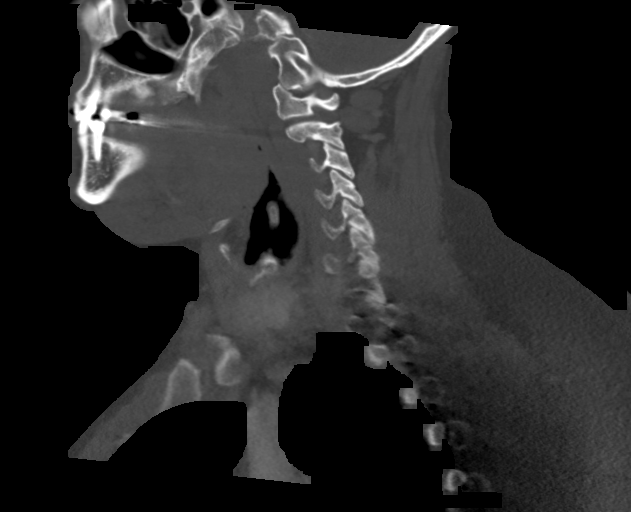
[im 97/145  bone]
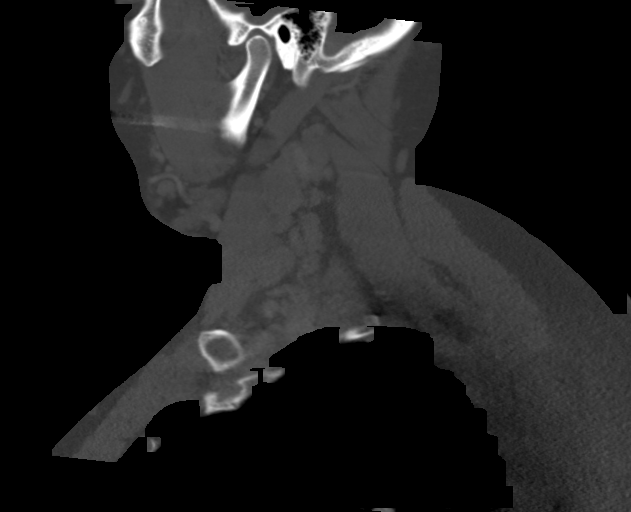

[Series 7: cor neck · coronal · 0.52mm/px · 3 of 156 slices shown]
[im 35/156  bone]
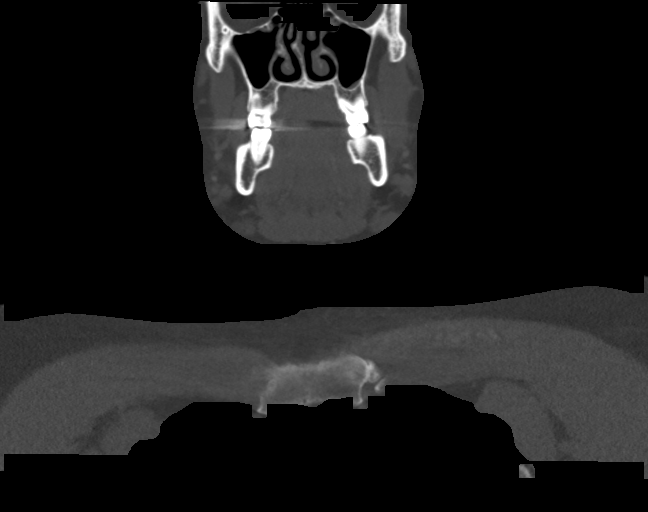
[im 64/156  bone]
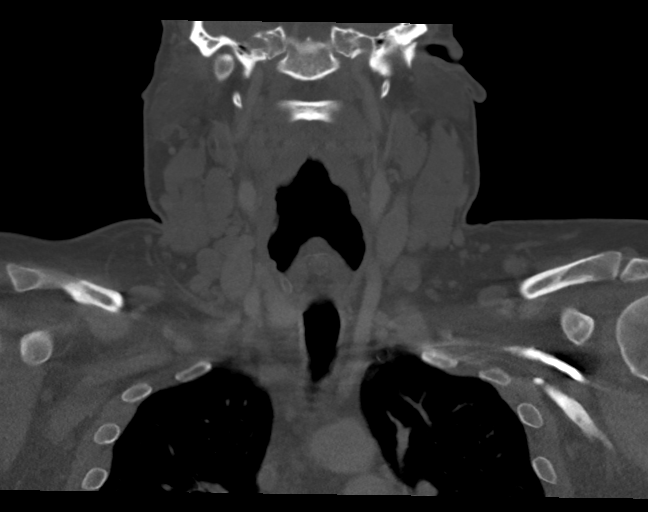
[im 93/156  bone]
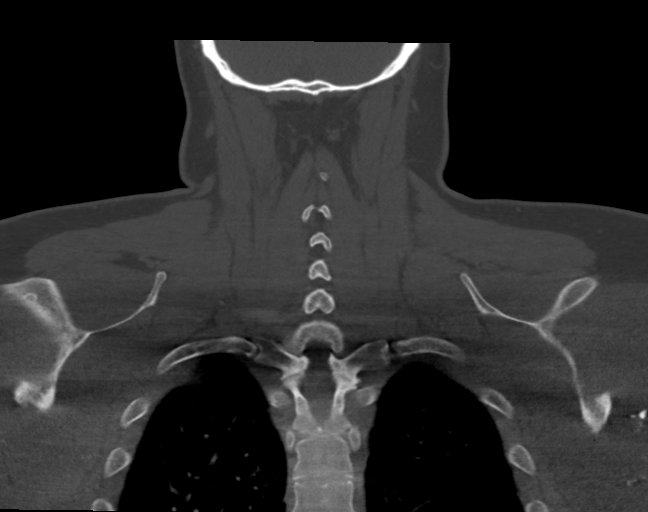

[Series 8: orthogonal ax · axial · 0.53mm/px · z∈[-288,-141]mm · 4 of 126 slices shown, 5 images]
[im 26/126  soft-tissue]
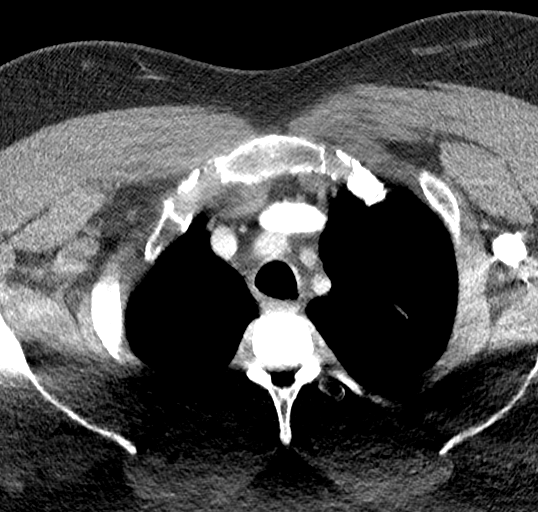
[im 26/126  bone]
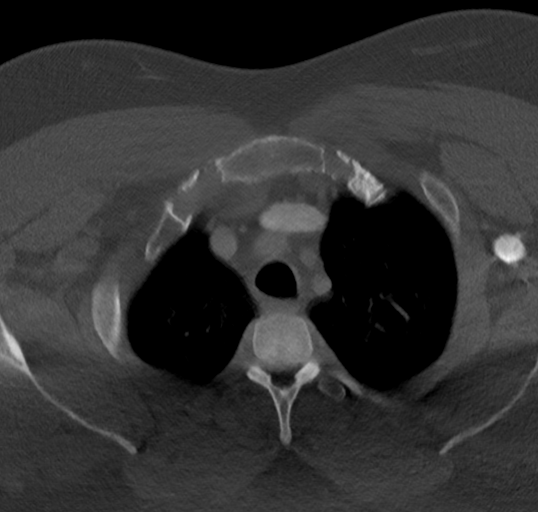
[im 51/126  bone]
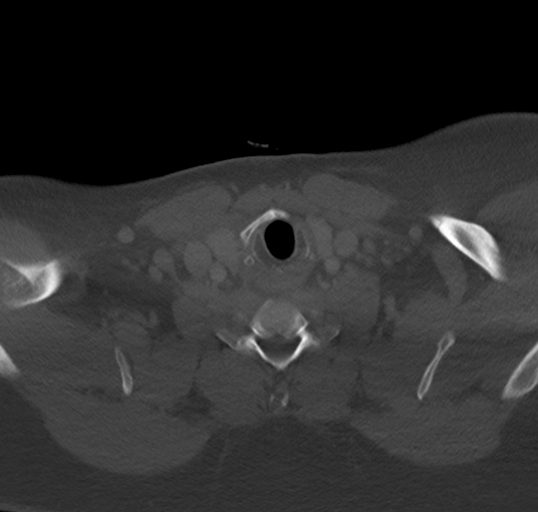
[im 76/126  bone]
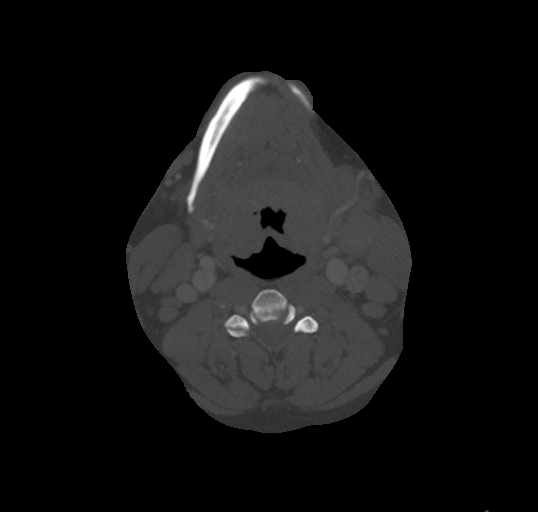
[im 101/126  bone]
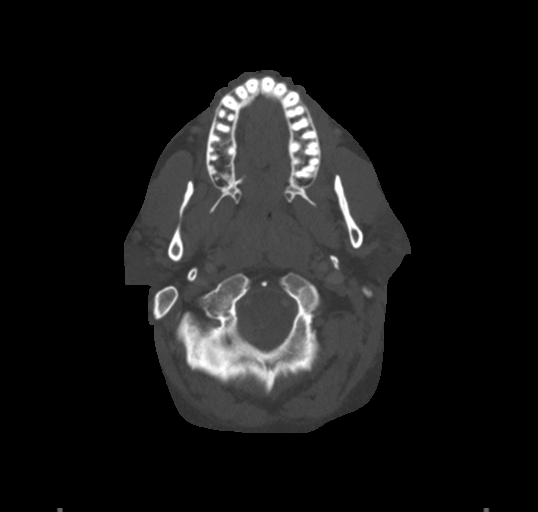

[14 of 33 positions shown; findings below may reference images not displayed]

FINDINGS: Pharynx and larynx: Oral cavity within normal limits without mass
lesion or loculated fluid collection. No acute abnormality about the
dentition. Palatine tonsils are symmetrically enlarged bilaterally,
suggesting possible acute tonsillitis. Mild induration within the
adjacent parapharyngeal fat, slightly more prominent on the left.
Superimposed calcified tonsilliths noted bilaterally. No discrete
tonsillar or peritonsillar abscess. Adenoidal soft tissues prominent
as well. Nasopharynx and oropharynx otherwise within normal limits.
No retropharyngeal collection. No significant epiglottic
enlargement. Lingual tonsils largely filled the vallecula. Remainder
of the hypopharynx and supraglottic larynx within normal limits.
True cords apposed and not well evaluated. Subglottic airway clear.

Salivary glands: Salivary glands including the parotid and
submandibular glands are within normal limits.

Thyroid: Thyroid normal.

Lymph nodes: Enlarged bilateral level II lymph nodes measure up to
17 mm on the left and 14 mm on the right. Additional prominent level
III nodes measure up to 14 mm on the right and 10 mm on the left.
Findings most likely reactive.

Vascular: Normal intravascular enhancement seen throughout the neck.

Limited intracranial: Unremarkable.

Visualized orbits: Visualized globes and orbital soft tissues within
normal limits.

Mastoids and visualized paranasal sinuses: Mild mucosal thickening
within the maxillary sinuses. Visualized paranasal sinuses are
otherwise clear. Visualize mastoids and middle ear cavities are
clear.

Skeleton: No acute osseous abnormality. No worrisome lytic or
blastic osseous lesions.

Upper chest: Visualized upper chest within normal limits. Visualized
lungs are grossly clear. Subcentimeter calcified granuloma noted at
the posterior right upper lung.

Other: None.
IMPRESSION: 1. Diffuse prominence of the palatine tonsils and adenoidal soft
tissues, suggesting acute tonsillitis. No discrete tonsillar or
peritonsillar abscess identified.
2. Enlarged bilateral level II-III lymph nodes as above, likely
reactive.

## 2019-04-16 ENCOUNTER — Other Ambulatory Visit: Payer: Self-pay

## 2019-04-16 ENCOUNTER — Ambulatory Visit (INDEPENDENT_AMBULATORY_CARE_PROVIDER_SITE_OTHER): Payer: Commercial Managed Care - HMO | Admitting: Family Medicine

## 2019-04-16 ENCOUNTER — Encounter: Payer: Self-pay | Admitting: Family Medicine

## 2019-04-16 VITALS — BP 128/61 | HR 77 | Temp 97.1°F | Ht 70.0 in | Wt >= 6400 oz

## 2019-04-16 DIAGNOSIS — Z7689 Persons encountering health services in other specified circumstances: Secondary | ICD-10-CM

## 2019-04-16 DIAGNOSIS — R635 Abnormal weight gain: Secondary | ICD-10-CM

## 2019-04-16 DIAGNOSIS — F429 Obsessive-compulsive disorder, unspecified: Secondary | ICD-10-CM

## 2019-04-16 DIAGNOSIS — J302 Other seasonal allergic rhinitis: Secondary | ICD-10-CM

## 2019-04-16 DIAGNOSIS — F17200 Nicotine dependence, unspecified, uncomplicated: Secondary | ICD-10-CM

## 2019-04-16 DIAGNOSIS — Z6841 Body Mass Index (BMI) 40.0 and over, adult: Secondary | ICD-10-CM

## 2019-04-16 DIAGNOSIS — R600 Localized edema: Secondary | ICD-10-CM

## 2019-04-16 MED ORDER — BUPROPION HCL ER (SR) 150 MG PO TB12: ORAL_TABLET | ORAL | 0 refills | Status: DC

## 2019-04-16 MED ORDER — FLUTICASONE PROPIONATE 50 MCG/ACT NA SUSP: 2.0000 | Freq: Every day | NASAL | 12 refills | Status: DC

## 2019-04-16 MED ORDER — FUROSEMIDE 20 MG PO TABS: 20.0000 mg | ORAL_TABLET | Freq: Every day | ORAL | 6 refills | Status: DC

## 2019-04-16 NOTE — Assessment & Plan Note (Addendum)
Previous patient of Dr. Luan Pulling from 2017 at [facility].  Has not met with any other providers since his last PCP visit.  No records to request.    Plan: 1) Increase physical activity to 30 minutes most days of the week.  2)Eat healthy diet high in vegetables and fruits; low in refined carbohydrates. 3) Labs as ordered 4) Due for TDAP vaccine, requesting to wait until follow up visit as he is beginning a new exercise routine today 5) We will see you back in 4 weeks for re-evaluation and for your TDAP vaccine

## 2019-04-16 NOTE — Assessment & Plan Note (Signed)
Pt does not yet desire to quit smoking.  Currently smokes 1 PPD.  Pt has some interest in discussing a plan to quit.  Plan: 1. Continue to assess readiness to quit at all subsequent office visits.

## 2019-04-16 NOTE — Assessment & Plan Note (Signed)
Previously treated by Dr. Juanetta Gosling with Lasix 20mg  daily with great relief of symptoms.  Discussed use of compression stockings up to knee to help with lower extremity edema, weight loss, drinking enough fluids daily, diet and exercise.  Plan: 1) Restart Lasix 20mg  daily 2) Drink 150-200 ounces of water daily 3) Begin wearing compression stockings up to the knee, putting on first thing in the morning, and may find these comfortable during work out sessions as well 4) We will see you back in 4 weeks for re-evaluation, sooner should the need arise

## 2019-04-16 NOTE — Assessment & Plan Note (Signed)
Previously treated by PCP Dr. Juanetta Gosling with Wellbutrin to help with smoking cessation and lessening compulsive eating.  Reports did not take medication long enough in the past to determine if was working for him or not.  Requesting to restart medications.  Plan: 1) Restart Wellbutrin on taper up dosing as sent to the pharmacy 2) Practice mindfulness with eating/drinking and actively work towards smoking cessation. 3) We will see you back in clinic in 4 weeks for re-evaluation

## 2019-04-16 NOTE — Assessment & Plan Note (Signed)
Restarting Flonase as previously directed.  As we discussed, be sure to tilt the applicator of Flonase towards the outer eye of the nostril you are in (right nostril - tilt towards right outer eye, etc) and spray so it can penetrate the sinuses.  Plan: 1) Restart Flonase 2) See you back in clinic as needed

## 2019-04-16 NOTE — Assessment & Plan Note (Signed)
Current BMI of 58.69 at 409lbs.   Last recorded weight in Epic was 05/29/2017 at 360lbs. Encouraged diet and exercise along with drinking water at upwards of 150-200 ounces per day.  Plan: 1) Heart healthy diet with increased intake of fruits/vegetables 2) Daily exercise for at least 30 minutes per day, or every other day, going no more than 2 days in a row without exercise 3) Increased water intake 4) Discussed that changes may be small but will add up over time, to not be frustrated if not seeing visual changes immediately.  To keep pushing towards dietary changes and active movement with exercise daily. 5) Will see you back in clinic in 4 weeks for re-evaluation

## 2019-04-16 NOTE — Progress Notes (Signed)
Subjective:    Patient ID: Timothy Beck, male    DOB: 04-26-1980, 39 y.o.   MRN: [id number]  Timothy Beck is a 39 y.o. male presenting on 04/16/2019 for Obesity (concern about fluid retention. also intermittent bilateral lower extermity, shin and ankles swelling that worsen as the day progresses. )   HPI  HEALTH MAINTENANCE:  Timothy Beck presents to clinic for concerns of pedal edema and compulsive eating/smoking.  States in the past he was on lasix 64m daily and wellbutrin 1538mtwice daily.  Reports lasix had given him relief and he had stopped taking the medication once his symptoms resolved.  After he had stopped the medication, his symptoms have returned.  Bilateral pedal edema worsens throughout the day and has worsened with recent weight gain.  Was taking Wellbutrin in the past but believes was not taking it long enough to have determined if it was helping him or not with his compulsive eating and smoking, looking to restart on that medication today.  Denies headaches, visual changes, SOB, CP, numbness/tingling/weakness, dizziness, lightheadedness, abdominal pain, n/v/d, difficulty with ambulation, fatigue.  Previous PCP was at Dr. HaLuan Pullingn this clinic in 2017.  Records will not be requested as they are on file.  Past medical, family, and surgical history reviewed w/ pt.  Weight/BMI: Gained 49lbs since [facility]: Working on establishing a work out routine and has sought out a trClinical research associateiet: Regular, went to [place] breakfast/lunch shakes Seatbelt: Most of the time Sunscreen: Only when needed, such as fishing HIV & Hep C Screening: Completed 2014 Optometry: Due - will schedule Dentistry: Regularly  IMMUNIZATIONS: Influenza: Declined Tetanus: Will complete in 4 weeks at next visit COVID: Planning on scheduling for COVID vaccine   [facility] 04/16/2019 08/25/2015 07/23/2015  Decreased Interest _0 Down, Depressed, Hopeless 0 1 1  PHQ - 2 Score _1 Altered sleeping 0 1 0  Tired, decreased energy 0 1 0  Change in appetite _2 Feeling bad or failure about yourself  _3 Trouble concentrating 0 0 0  Moving slowly or fidgety/restless 0 0 0  Suicidal thoughts 0 0 0  PHQ-9 Score _4 Difficult doing work/chores Not difficult at all - Not difficult at all    Past Medical History:  Diagnosis Date  . Heartburn    History reviewed. No pertinent surgical history. Social History   Socioeconomic History  . Marital status: Single    Spouse name: Not on file  . Number of children: Not on file  . Years of education: Not on file  . Highest education level: Not on file  Occupational History  . Not on file  Tobacco Use  . Smoking status: Current Every Day Smoker    Packs/day: 1.00    Years: 12.00    Pack years: 12.00    Types: Cigarettes  . Smokeless tobacco: Current User    Types: Snuff  Substance and Sexual Activity  . Alcohol use: Yes    Alcohol/week: 4.0 standard drinks    Types: 4 Cans of beer per week  . Drug use: No  . Sexual activity: Not on file  Other Topics Concern  . Not on file  Social History Narrative  . Not on file   Social Determinants of Health   Financial Resource Strain:   . Difficulty of Paying Living Expenses:   Food Insecurity:   .  Worried About Charity fundraiser in the Last Year:   . Arboriculturist in the Last Year:   Transportation Needs:   . Film/video editor (Medical):   Marland Kitchen Lack of Transportation (Non-Medical):   Physical Activity:   . Days of Exercise per Week:   . Minutes of Exercise per Session:   Stress:   . Feeling of Stress :   Social Connections:   . Frequency of Communication with Friends and Family:   . Frequency of Social Gatherings with Friends and Family:   . Attends Religious Services:   . Active Member of Clubs or Organizations:   . Attends Archivist Meetings:   Marland Kitchen Marital Status:     Intimate Partner Violence:   . Fear of Current or Ex-Partner:   . Emotionally Abused:   Marland Kitchen Physically Abused:   . Sexually Abused:    Family History  Problem Relation Age of Onset  . Diabetes Mother   . Alcohol abuse Father   . Hypertension Daughter    No current outpatient medications on file prior to visit.   No current facility-administered medications on file prior to visit.    Per HPI unless specifically indicated above     Objective:    BP 128/61 (BP Location: Right Arm, Patient Position: Sitting, Cuff Size: Large)   Pulse 77   Temp (!) 97.1 F (36.2 C) (Temporal)   Ht _0  (1.778 m)   Wt (!) 409 lb (185.5 kg)   SpO2 100%   BMI 58.69 kg/m   Wt Readings from Last 3 Encounters:  04/16/19 (!) 409 lb (185.5 kg)  05/29/17 (!) 360 lb (163.3 kg)  12/02/15 (!) 378 lb (171.5 kg)   Physical Exam Vitals reviewed.  Constitutional:      General: He is not in acute distress.    Appearance: Normal appearance. He is well-groomed. He is obese. He is not ill-appearing or toxic-appearing.  HENT:     Head: Normocephalic.  Eyes:     General: Lids are normal. Vision grossly intact.        Right eye: No discharge.        Left eye: No discharge.     Extraocular Movements: Extraocular movements intact.     Conjunctiva/sclera: Conjunctivae normal.     Pupils: Pupils are equal, round, and reactive to light.  Cardiovascular:     Rate and Rhythm: Normal rate and regular rhythm.     Pulses: Normal pulses.          Dorsalis pedis pulses are 2+ on the right side and 2+ on the left side.       Posterior tibial pulses are 2+ on the right side and 2+ on the left side.     Heart sounds: Normal heart sounds. No murmur. No friction rub. No gallop.   Pulmonary:     Effort: Pulmonary effort is normal. No respiratory distress.     Breath sounds: Normal breath sounds.  Abdominal:     General: Bowel sounds are normal.     Palpations: Abdomen is soft.     Tenderness: There is no abdominal  tenderness.     Comments: Unable to assess for splenomegaly or hepatomegaly due to body habitus  Musculoskeletal:     Cervical back: Full passive range of motion without pain.     Right lower leg: 1+ Edema present.     Left lower leg: 1+ Edema present.  Feet:     Right  foot:     Skin integrity: Skin integrity normal.     Left foot:     Skin integrity: Skin integrity normal.  Skin:    General: Skin is warm and dry.     Capillary Refill: Capillary refill takes less than 2 seconds.  Neurological:     General: No focal deficit present.     Mental Status: He is alert and oriented to person, place, and time.     Cranial Nerves: Cranial nerves are intact. No cranial nerve deficit.     Sensory: Sensation is intact. No sensory deficit.     Motor: Motor function is intact. No weakness.     Coordination: Coordination is intact. Coordination normal.     Gait: Gait is intact. Gait normal.  Psychiatric:        Attention and Perception: Attention and perception normal.        Mood and Affect: Mood and affect normal.        Speech: Speech normal.        Behavior: Behavior normal. Behavior is cooperative.        Thought Content: Thought content normal.        Cognition and Memory: Cognition and memory normal.        Judgment: Judgment normal.     Results for orders placed or performed during the hospital encounter of 05/29/17  Group A Strep by PCR   Specimen: Throat; Sterile Swab  Result Value Ref Range   Group A Strep by PCR NOT DETECTED NOT DETECTED  Comprehensive metabolic panel  Result Value Ref Range   Sodium 134 (L) 135 - 145 mmol/L   Potassium 4.0 3.5 - 5.1 mmol/L   Chloride 99 (L) 101 - 111 mmol/L   CO2 25 22 - 32 mmol/L   Glucose, Bld 104 (H) 65 - 99 mg/dL   BUN 16 6 - 20 mg/dL   Creatinine, Ser 0.98 0.61 - 1.24 mg/dL   Calcium 8.6 (L) 8.9 - 10.3 mg/dL   Total Protein 7.7 6.5 - 8.1 g/dL   Albumin 4.0 3.5 - 5.0 g/dL   AST 28 15 - 41 U/L   ALT 27 17 - 63 U/L   Alkaline  Phosphatase 64 38 - 126 U/L   Total Bilirubin 0.7 0.3 - 1.2 mg/dL   GFR calc non Af Amer >60 >60 mL/min   GFR calc Af Amer >60 >60 mL/min   Anion gap 10 5 - 15  CBC with Differential  Result Value Ref Range   WBC 8.8 3.8 - 10.6 K/uL   RBC 4.78 4.40 - 5.90 MIL/uL   Hemoglobin 13.9 13.0 - 18.0 g/dL   HCT 39.8 (L) 40.0 - 52.0 %   MCV 83.4 80.0 - 100.0 fL   MCH 29.1 26.0 - 34.0 pg   MCHC 35.0 32.0 - 36.0 g/dL   RDW 15.0 (H) 11.5 - 14.5 %   Platelets 202 150 - 440 K/uL   Neutrophils Relative % 74 %   Neutro Abs 6.5 1.4 - 6.5 K/uL   Lymphocytes Relative 16 %   Lymphs Abs 1.4 1.0 - 3.6 K/uL   Monocytes Relative 10 %   Monocytes Absolute 0.8 0.2 - 1.0 K/uL   Eosinophils Relative 0 %   Eosinophils Absolute 0.0 0 - 0.7 K/uL   Basophils Relative 0 %   Basophils Absolute 0.0 0 - 0.1 K/uL      Assessment & Plan:   Problem List Items Addressed This Visit  Other   Smoker    Pt does not yet desire to quit smoking.  Currently smokes 1 PPD.  Pt has some interest in discussing a plan to quit.  Plan: 1. Continue to assess readiness to quit at all subsequent office visits.       Obesity - Primary    Current BMI of 58.69 at 409lbs.   Last recorded weight in Epic was 05/29/2017 at 360lbs. Encouraged diet and exercise along with drinking water at upwards of 150-200 ounces per day.  Plan: 1) Heart healthy diet with increased intake of fruits/vegetables 2) Daily exercise for at least 30 minutes per day, or every other day, going no more than 2 days in a row without exercise 3) Increased water intake 4) Discussed that changes may be small but will add up over time, to not be frustrated if not seeing visual changes immediately.  To keep pushing towards dietary changes and active movement with exercise daily. 5) Will see you back in clinic in 4 weeks for re-evaluation      Relevant Orders   CBC with Differential   COMPLETE METABOLIC PANEL WITH GFR   Lipid Profile   HgB A1c    Encounter to establish care    Previous patient of Dr. Luan Beck from 2017 at [facility]  Has not met with any other providers since his last PCP visit.  No records to request.    Plan: 1) Increase physical activity to 30 minutes most days of the week.  2)Eat healthy diet high in vegetables and fruits; low in refined carbohydrates. 3) Labs as ordered 4) Due for TDAP vaccine, requesting to wait until follow up visit as he is beginning a new exercise routine today 5) We will see you back in 4 weeks for re-evaluation and for your TDAP vaccine       Compulsive disorder    Previously treated by PCP Dr. Luan Beck with Wellbutrin to help with smoking cessation and lessening compulsive eating.  Reports did not take medication long enough in the past to determine if was working for him or not.  Requesting to restart medications.  Plan: 1) Restart Wellbutrin on taper up dosing as sent to the pharmacy 2) Practice mindfulness with eating/drinking and actively work towards smoking cessation. 3) We will see you back in clinic in 4 weeks for re-evaluation      Relevant Medications   buPROPion (WELLBUTRIN SR) 150 MG 12 hr tablet   Seasonal allergies    Restarting Flonase as previously directed.  As we discussed, be sure to tilt the applicator of Flonase towards the outer eye of the nostril you are in (right nostril - tilt towards right outer eye, etc) and spray so it can penetrate the sinuses.  Plan: 1) Restart Flonase 2) See you back in clinic as needed      Relevant Medications   fluticasone (FLONASE) 50 MCG/ACT nasal spray   Pedal edema    Previously treated by Dr. Luan Beck with Lasix 70m daily with great relief of symptoms.  Discussed use of compression stockings up to knee to help with lower extremity edema, weight loss, drinking enough fluids daily, diet and exercise.  Plan: 1) Restart Lasix 224mdaily 2) Drink 150-200 ounces of water daily 3) Begin wearing compression stockings up to the knee,  putting on first thing in the morning, and may find these comfortable during work out sessions as well 4) We will see you back in 4 weeks for re-evaluation, sooner should the need  arise      Relevant Medications   furosemide (LASIX) 20 MG tablet   Other Relevant Orders   CBC with Differential   COMPLETE METABOLIC PANEL WITH GFR   Lipid Profile    Other Visit Diagnoses    Weight gain       Relevant Orders   Lipid Profile   Thyroid Panel With TSH   HgB A1c      Meds ordered this encounter  Medications  . furosemide (LASIX) 20 MG tablet    Sig: Take 1 tablet (20 mg total) by mouth daily.    Dispense:  30 tablet    Refill:  6  . buPROPion (WELLBUTRIN SR) 150 MG 12 hr tablet    Sig: Take 1 tablet (150 mg total) by mouth daily for 7 days, THEN 1 tablet (150 mg total) 2 (two) times daily.    Dispense:  67 tablet    Refill:  0  . fluticasone (FLONASE) 50 MCG/ACT nasal spray    Sig: Place 2 sprays into both nostrils daily.    Dispense:  16 g    Refill:  12      Follow up plan: Return in about 4 weeks (around 05/14/2019) for Weight check, Pedal edema & TDAP.  Harlin Rain, FNP-C Family Nurse Practitioner [facility] 04/16/2019, 10:35 AM

## 2019-04-16 NOTE — Patient Instructions (Addendum)
As we discussed, have your labs drawn in the next 1-2 weeks and we will contact you with the results.  We will plan to see you back in clinic in 4 weeks to reassess your weight and lower extremity edema.  We can restart your Wellbutrin, the Lasix and Flonase.  Take as directed.  Work on [employer] and exercise routine.  Call us or send [place] a MyChart message if you have any additional questions/concerns/needs prior to your next office visit.  You will receive a survey after today's visit either digitally by e-mail or paper by [employer]. Your experiences and feedback matter to [place].  Please respond so we know how we are doing as we provide care for you.  Call us with any questions/concerns/needs.  It is my goal to be available to you for your health concerns.  Thanks for choosing me to be a partner in your healthcare needs!  Charlaine Dalton, FNP-C Family Nurse Practitioner [facility] Phone: [phone]

## 2019-04-17 ENCOUNTER — Other Ambulatory Visit: Payer: Self-pay | Admitting: Family Medicine

## 2019-04-17 DIAGNOSIS — Z7689 Persons encountering health services in other specified circumstances: Secondary | ICD-10-CM

## 2019-04-17 DIAGNOSIS — Z6841 Body Mass Index (BMI) 40.0 and over, adult: Secondary | ICD-10-CM

## 2019-04-17 LAB — CBC WITH DIFFERENTIAL/PLATELET
Absolute Monocytes: 528 cells/uL (ref 200–950)
Basophils Absolute: 44 cells/uL (ref 0–200)
Basophils Relative: 0.5 %
Eosinophils Absolute: 290 cells/uL (ref 15–500)
Eosinophils Relative: 3.3 %
HCT: 41.7 % (ref 38.5–50.0)
Hemoglobin: 14.4 g/dL (ref 13.2–17.1)
Lymphs Abs: 2279 cells/uL (ref 850–3900)
MCH: 29.8 pg (ref 27.0–33.0)
MCHC: 34.5 g/dL (ref 32.0–36.0)
MCV: 86.3 fL (ref 80.0–100.0)
MPV: 10.3 fL (ref 7.5–12.5)
Monocytes Relative: 6 %
Neutro Abs: 5658 cells/uL (ref 1500–7800)
Neutrophils Relative %: 64.3 %
Platelets: 274 10*3/uL (ref 140–400)
RBC: 4.83 10*6/uL (ref 4.20–5.80)
RDW: 14 % (ref 11.0–15.0)
Total Lymphocyte: 25.9 %
WBC: 8.8 10*3/uL (ref 3.8–10.8)

## 2019-04-17 LAB — HEMOGLOBIN A1C
Hgb A1c MFr Bld: 5.6 % of total Hgb (ref <5.7)
Mean Plasma Glucose: 114 (calc)
eAG (mmol/L): 6.3 (calc)

## 2019-04-17 LAB — LIPID PANEL
Cholesterol: 158 mg/dL (ref <200)
HDL: 46 mg/dL (ref >=40)
LDL Cholesterol (Calc): 93 mg/dL (calc)
Non-HDL Cholesterol (Calc): 112 mg/dL (calc) (ref <130)
Total CHOL/HDL Ratio: 3.4 (calc) (ref <5.0)
Triglycerides: 97 mg/dL (ref <150)

## 2019-04-17 LAB — COMPLETE METABOLIC PANEL WITH GFR
AG Ratio: 1.4 (calc) (ref 1.0–2.5)
ALT: 26 U/L (ref 9–46)
AST: 20 U/L (ref 10–40)
Albumin: 4.2 g/dL (ref 3.6–5.1)
Alkaline phosphatase (APISO): 81 U/L (ref 36–130)
BUN: 20 mg/dL (ref 7–25)
CO2: 29 mmol/L (ref 20–32)
Calcium: 9.7 mg/dL (ref 8.6–10.3)
Chloride: 104 mmol/L (ref 98–110)
Creat: 0.81 mg/dL (ref 0.60–1.35)
GFR, Est African American: 131 mL/min/{1.73_m2} (ref >=60)
GFR, Est Non African American: 113 mL/min/{1.73_m2} (ref >=60)
Globulin: 2.9 g/dL (calc) (ref 1.9–3.7)
Glucose, Bld: 107 mg/dL (ref 65–139)
Potassium: 4.8 mmol/L (ref 3.5–5.3)
Sodium: 142 mmol/L (ref 135–146)
Total Bilirubin: 0.4 mg/dL (ref 0.2–1.2)
Total Protein: 7.1 g/dL (ref 6.1–8.1)

## 2019-04-17 LAB — THYROID PANEL WITH TSH
Free Thyroxine Index: 2.5 (ref 1.4–3.8)
T3 Uptake: 33 % (ref 22–35)
T4, Total: 7.7 ug/dL (ref 4.9–10.5)
TSH: 1.64 mIU/L (ref 0.40–4.50)

## 2019-04-17 MED ORDER — SAXENDA 18 MG/3ML ~~LOC~~ SOPN: 3.0000 mg | PEN_INJECTOR | Freq: Every day | SUBCUTANEOUS | 0 refills | Status: DC

## 2019-04-17 MED ORDER — INSULIN PEN NEEDLE 31G X 5 MM MISC: 0 refills | Status: DC

## 2019-04-17 NOTE — Progress Notes (Signed)
Labs are all within normal limits.  I have sent in a prescription for Saxenda as we discussed during his visit yesterday to his pharmacy.

## 2019-05-01 ENCOUNTER — Telehealth: Payer: Self-pay | Admitting: Family Medicine

## 2019-05-01 NOTE — Telephone Encounter (Signed)
Left message, requested call back.  Wanting to find out about other alternative medications he may have tried and to put a plan together for weight management.

## 2019-05-02 NOTE — Telephone Encounter (Signed)
Left a detail message on the patient voicemail on how to go and print the saving card. We do not have the card in the office and the cards have to be printed by the patients.

## 2019-05-02 NOTE — Telephone Encounter (Signed)
Can you print out the [employer] information for [place]?  He is going to stop by after 3pm to pick it up and bring to [facility].  Thanks

## 2019-05-17 ENCOUNTER — Encounter: Payer: Self-pay | Admitting: Family Medicine

## 2019-05-17 ENCOUNTER — Ambulatory Visit (INDEPENDENT_AMBULATORY_CARE_PROVIDER_SITE_OTHER): Payer: Self-pay | Admitting: Family Medicine

## 2019-05-17 ENCOUNTER — Other Ambulatory Visit: Payer: Self-pay

## 2019-05-17 VITALS — BP 136/59 | HR 70 | Temp 97.5°F | Ht 70.0 in | Wt 399.0 lb

## 2019-05-17 DIAGNOSIS — R600 Localized edema: Secondary | ICD-10-CM

## 2019-05-17 DIAGNOSIS — Z7689 Persons encountering health services in other specified circumstances: Secondary | ICD-10-CM

## 2019-05-17 DIAGNOSIS — Z6841 Body Mass Index (BMI) 40.0 and over, adult: Secondary | ICD-10-CM

## 2019-05-17 DIAGNOSIS — R3129 Other microscopic hematuria: Secondary | ICD-10-CM

## 2019-05-17 LAB — POCT URINALYSIS DIPSTICK
Bilirubin, UA: NEGATIVE
Glucose, UA: NEGATIVE
Ketones, UA: NEGATIVE
Leukocytes, UA: NEGATIVE
Nitrite, UA: NEGATIVE
Protein, UA: POSITIVE — AB
Spec Grav, UA: 1.02 (ref 1.010–1.025)
Urobilinogen, UA: 0.2 E.U./dL
pH, UA: 6 (ref 5.0–8.0)

## 2019-05-17 MED ORDER — SAXENDA 18 MG/3ML ~~LOC~~ SOPN: 3.0000 mg | PEN_INJECTOR | Freq: Every day | SUBCUTANEOUS | 0 refills | Status: DC

## 2019-05-17 NOTE — Patient Instructions (Signed)
Continue working on your diet and exercise routine, you have done wonderful with a 10+lb weight loss in the last month.  I will continue working on your [place] authorization.  We have given you a sample package of Saxenda today to begin using at the 0.6mg  daily for the next 7 days and to increase to 1.2mg  daily next week, increasing in increments of 0.6mg  weekly until you reach 3.0mg  per day.  As discussed, take this medication and wait to eat/drink anything for 45 minutes.  We will plan to see you back in clinic in 1 month for re-evaluation.  Call us sooner, if anything else should arise.  You will receive a survey after today's visit either digitally by e-mail or paper by [employer]. Your experiences and feedback matter to [place].  Please respond so we know how we are doing as we provide care for you.  Call us with any questions/concerns/needs.  It is my goal to be available to you for your health concerns.  Thanks for choosing me to be a partner in your healthcare needs!  Charlaine Dalton, FNP-C Family Nurse Practitioner [facility] Phone: [phone]

## 2019-05-17 NOTE — Assessment & Plan Note (Signed)
Since last month has lost >10lbs with diet and exercise.  Sample of Saxenda given in clinic today to begin tomorrow utilizing 0.6mg  daily x 7 days, increasing in increments of 0.6mg  weekly until reaching 3.0mg  daily.  Will resend prescription for Saxenda and look for prior authorization for coverage.  Plan: 1. Continue diet and exercise routine 2. Increase water to 150 ounces of water per day  3. Saxenda sample to begin, instruction given in room on usage 4. Will await prior authorization from insurance company for [place] coverage 5. Follow up in 1 month

## 2019-05-17 NOTE — Progress Notes (Signed)
Subjective:    Patient ID: Timothy Beck, male    DOB: October 12, 1980, 39 y.o.   MRN: [id number]  Timothy Beck is a 40 y.o. male presenting on 05/17/2019 for Weight Check (follow up for weight and pedal edema)   HPI  Timothy Beck presents for follow up weight check from his last visit.  Has lost approximately 10lbs since last month.  Reports he weighed himself at home and weighed in at 394, making his weight loss approx 15lbs from last month to this month with diet and exercise.  Reports he was not approved for the Saxenda and reviewed his insurance companies required medications to try and fail prior to approval.    Depression screen [facility] 2/9 04/16/2019 08/25/2015 07/23/2015  Decreased Interest 1 1 2   Down, Depressed, Hopeless 0 1 1  PHQ - 2 Score 1 2 3   Altered sleeping 0 1 0  Tired, decreased energy 0 1 0  Change in appetite 1 1 2   Feeling bad or failure about yourself  3 1 2   Trouble concentrating 0 0 0  Moving slowly or fidgety/restless 0 0 0  Suicidal thoughts 0 0 0  PHQ-9 Score 5 6 7   Difficult doing work/chores Not difficult at all - Not difficult at all    Social History   Tobacco Use  . Smoking status: Current Every Day Smoker    Packs/day: 1.00    Years: 12.00    Pack years: 12.00    Types: Cigarettes  . Smokeless tobacco: Current User    Types: Snuff  Substance Use Topics  . Alcohol use: Yes    Alcohol/week: 4.0 standard drinks    Types: 4 Cans of beer per week  . Drug use: No    Review of Systems  Constitutional: Negative.   HENT: Negative.   Eyes: Negative.   Respiratory: Negative.   Cardiovascular: Negative.   Gastrointestinal: Negative.   Endocrine: Negative.   Genitourinary: Negative.   Musculoskeletal: Negative.   Skin: Negative.   Allergic/Immunologic: Negative.   Neurological: Negative.   Hematological: Negative.   Psychiatric/Behavioral: Negative.    Per HPI unless specifically indicated above     Objective:    BP (!) 136/59 (BP  Location: Right Arm, Patient Position: Sitting, Cuff Size: Large)   Pulse 70   Temp (!) 97.5 F (36.4 C) (Temporal)   Ht 5\' 10"  (1.778 m)   Wt (!) 399 lb (181 kg)   SpO2 97%   BMI 57.25 kg/m   Wt Readings from Last 3 Encounters:  05/17/19 (!) 399 lb (181 kg)  04/16/19 (!) 409 lb (185.5 kg)  05/29/17 (!) 360 lb (163.3 kg)    Physical Exam Vitals reviewed.  Constitutional:      General: He is not in acute distress.    Appearance: Normal appearance. He is well-developed and well-groomed. He is obese. He is not ill-appearing or toxic-appearing.  HENT:     Head: Normocephalic.  Eyes:     General:        Right eye: No discharge.        Left eye: No discharge.     Extraocular Movements: Extraocular movements intact.     Conjunctiva/sclera: Conjunctivae normal.     Pupils: Pupils are equal, round, and reactive to light.  Cardiovascular:     Pulses: Normal pulses.  Pulmonary:     Effort: No respiratory distress.  Musculoskeletal:     Right lower leg: 1+ Edema present.     Left lower  leg: 1+ Edema present.  Skin:    General: Skin is warm and dry.     Capillary Refill: Capillary refill takes less than 2 seconds.  Neurological:     General: No focal deficit present.     Mental Status: He is alert and oriented to person, place, and time.     Cranial Nerves: No cranial nerve deficit.     Sensory: No sensory deficit.     Motor: No weakness.     Coordination: Coordination normal.     Gait: Gait normal.  Psychiatric:        Attention and Perception: Attention and perception normal.        Mood and Affect: Mood and affect normal.        Speech: Speech normal.        Behavior: Behavior normal. Behavior is cooperative.        Thought Content: Thought content normal.        Cognition and Memory: Cognition and memory normal.        Judgment: Judgment normal.     Results for orders placed or performed in visit on 05/17/19  POCT Urinalysis Dipstick  Result Value Ref Range   Color,  UA Dark yellow    Clarity, UA Clear    Glucose, UA Negative Negative   Bilirubin, UA Negative    Ketones, UA Negative    Spec Grav, UA 1.020 1.010 - 1.025   Blood, UA Trace    pH, UA 6.0 5.0 - 8.0   Protein, UA Positive (A) Negative   Urobilinogen, UA 0.2 0.2 or 1.0 E.U./dL   Nitrite, UA Negative    Leukocytes, UA Negative Negative   Appearance     Odor        Assessment & Plan:   Problem List Items Addressed This Visit      Other   Obesity   Relevant Medications   Liraglutide -Weight Management (SAXENDA) 18 MG/3ML SOPN   Pedal edema    Reports better response with pedal edema since beginning lasix 20mg  daily as previously prescribed by Dr. .  Reports has some residual edema.  Discussed compression socks daily that come up to the knee and that this may begin to resolve with additional weight loss.  Plan: 1. Continue lasix 20mg  daily 2. Begin to wear compression socks to the knee 3. Will re-evaluate in 1 month      Morbid obesity due to excess calories (HCC)    Since last month has lost >10lbs with diet and exercise.  Sample of Saxenda given in clinic today to begin tomorrow utilizing 0.6mg  daily x 7 days, increasing in increments of 0.6mg  weekly until reaching 3.0mg  daily.  Will resend prescription for Saxenda and look for prior authorization for coverage.  Plan: 1. Continue diet and exercise routine 2. Increase water to 150 ounces of water per day  3. Saxenda sample to begin, instruction given in room on usage 4. Will await prior authorization from insurance company for [place] coverage 5. Follow up in 1 month      Relevant Medications   Liraglutide -Weight Management (SAXENDA) 18 MG/3ML SOPN    Other Visit Diagnoses    Microscopic hematuria    -  Primary   Relevant Orders   POCT Urinalysis Dipstick (Completed)   Urinalysis, microscopic only   Encounter for weight management       Relevant Medications   Liraglutide -Weight Management (SAXENDA) 18 MG/3ML  SOPN  Meds ordered this encounter  Medications  . Liraglutide -Weight Management (SAXENDA) 18 MG/3ML SOPN    Sig: Inject 0.5 mLs (3 mg total) into the skin daily. To inject 0.56mL daily into the skin x 1 week, then increase weekly by 0.69mL until reaching 3.75mL daily (0.6, 1.2, 1.8, 2.4, 3.0)    Dispense:  5 pen    Refill:  0    Please let us know if needed prior auth    Follow up plan: Return in about 4 weeks (around 06/14/2019) for Weight recheck.   Charlaine Dalton, FNP Family Nurse Practitioner [facility] 05/17/2019, 8:54 AM

## 2019-05-17 NOTE — Assessment & Plan Note (Signed)
Reports better response with pedal edema since beginning lasix 20mg  daily as previously prescribed by Dr. .  Reports has some residual edema.  Discussed compression socks daily that come up to the knee and that this may begin to resolve with additional weight loss.  Plan: 1. Continue lasix 20mg  daily 2. Begin to wear compression socks to the knee 3. Will re-evaluate in 1 month

## 2019-05-18 LAB — URINALYSIS, MICROSCOPIC ONLY
Bacteria, UA: NONE SEEN /HPF
Hyaline Cast: NONE SEEN /LPF
RBC / HPF: NONE SEEN /HPF (ref 0–2)
Squamous Epithelial / LPF: NONE SEEN /HPF (ref <=5)
WBC, UA: NONE SEEN /HPF (ref 0–5)

## 2019-05-23 ENCOUNTER — Other Ambulatory Visit: Payer: Self-pay | Admitting: Family Medicine

## 2019-05-23 DIAGNOSIS — F429 Obsessive-compulsive disorder, unspecified: Secondary | ICD-10-CM

## 2019-05-23 NOTE — Telephone Encounter (Signed)
Requested medication (s) are due for refill today: yes  Requested medication (s) are on the active medication list: yes  Last refill:  04/16/19 #67  Future visit scheduled: yes, 07/02/19  Notes to clinic:  Please review for refill. Prescription was written with an end date of 05/23/19. Unclear if prescription should be refilled    Requested Prescriptions  Pending Prescriptions Disp Refills   buPROPion (WELLBUTRIN SR) 150 MG 12 hr tablet [Pharmacy Med Name: BUPROPION SR 150MG  TABLETS (12 H)] 67 tablet 0    Sig: TAKE 1 TABLET(150 MG) BY MOUTH DAILY FOR 7 DAYS THEN TAKE 1 TABLET(150 MG) BY MOUTH TWICE DAILY      Psychiatry: Antidepressants - bupropion Passed - 05/23/2019  9:50 AM      Passed - Last BP in normal range    BP Readings from Last 1 Encounters:  05/17/19 (!) 136/59          Passed - Valid encounter within last 6 months    Recent Outpatient Visits           6 days ago Microscopic hematuria   [facility], [facility], FNP   1 month ago Class 3 severe obesity due to excess calories without serious comorbidity with body mass index (BMI) of 50.0 to 59.9 in adult [facility])   [facility], [facility], FNP   3 years ago Class 3 obesity due to excess calories without serious comorbidity in adult, unspecified BMI   [facility], Juanetta Gosling., MD   3 years ago Obesity   [facility]., MD   3 years ago Compulsive disorder   [facility], Juanetta Gosling., MD       Future Appointments             In 1 month Malfi, Teresita Madura, FNP [facility], [facility]

## 2019-07-02 ENCOUNTER — Ambulatory Visit: Payer: Self-pay | Admitting: Family Medicine

## 2019-10-05 ENCOUNTER — Other Ambulatory Visit: Payer: Self-pay | Admitting: Family Medicine

## 2019-10-05 DIAGNOSIS — F429 Obsessive-compulsive disorder, unspecified: Secondary | ICD-10-CM

## 2019-10-05 NOTE — Telephone Encounter (Signed)
Requested  medications are  due for refill today yes  Requested medications are on the active medication list yes  Last refill 6/15  Last visit 03/2019  Future visit scheduled no  Notes to clinic office note states Welbutrin increasing dose as sent to pharmacy, please assess.

## 2020-06-02 ENCOUNTER — Ambulatory Visit: Payer: Self-pay | Admitting: Internal Medicine

## 2020-06-05 ENCOUNTER — Ambulatory Visit: Payer: BC Managed Care – PPO | Admitting: Internal Medicine

## 2020-06-05 ENCOUNTER — Other Ambulatory Visit: Payer: Self-pay

## 2020-06-05 ENCOUNTER — Encounter: Payer: Self-pay | Admitting: Internal Medicine

## 2020-06-05 DIAGNOSIS — I872 Venous insufficiency (chronic) (peripheral): Secondary | ICD-10-CM | POA: Diagnosis not present

## 2020-06-05 DIAGNOSIS — F17200 Nicotine dependence, unspecified, uncomplicated: Secondary | ICD-10-CM | POA: Diagnosis not present

## 2020-06-05 DIAGNOSIS — I10 Essential (primary) hypertension: Secondary | ICD-10-CM | POA: Diagnosis not present

## 2020-06-05 MED ORDER — FUROSEMIDE 20 MG PO TABS: 20.0000 mg | ORAL_TABLET | Freq: Every day | ORAL | 0 refills | Status: DC

## 2020-06-05 NOTE — Assessment & Plan Note (Signed)
We will check CBC, c-Met, TSH, lipid and A1c today Will consider Saxenda again and he agrees to be compliant with this Discussed consuming 6 small meals per day Discussed calorie counting to consume no more than 1600 cal/day Encourage a diet made up of mainly protein and vegetables Advised him to avoid drinking his calories, stick with water, black coffee or diet soda

## 2020-06-05 NOTE — Assessment & Plan Note (Signed)
He will restart Furosemide, Rx sent to pharmacy Encouraged DASH diet and exercise for weight loss

## 2020-06-05 NOTE — Patient Instructions (Signed)
Calorie Counting for Weight Loss Calories are units of energy. Your body needs a certain number of calories from food to keep going throughout the day. When you eat or drink more calories than your body needs, your body stores the extra calories mostly as fat. When you eat or drink fewer calories than your body needs, your body burns fat to get the energy it needs. Calorie counting means keeping track of how many calories you eat and drink each day. Calorie counting can be helpful if you need to lose weight. If you eat fewer calories than your body needs, you should lose weight. Ask your health care provider what a healthy weight is for you. For calorie counting to work, you will need to eat the right number of calories each day to lose a healthy amount of weight per week. A dietitian can help you figure out how many calories you need in a day and will suggest ways to reach your calorie goal.  A healthy amount of weight to lose each week is usually 1-2 lb (0.5-0.9 kg). This usually means that your daily calorie intake should be reduced by 500-750 calories.  Eating 1,200-1,500 calories a day can help most women lose weight.  Eating 1,500-1,800 calories a day can help most men lose weight. What do I need to know about calorie counting? Work with your health care provider or dietitian to determine how many calories you should get each day. To meet your daily calorie goal, you will need to:  Find out how many calories are in each food that you would like to eat. Try to do this before you eat.  Decide how much of the food you plan to eat.  Keep a food log. Do this by writing down what you ate and how many calories it had. To successfully lose weight, it is important to balance calorie counting with a healthy lifestyle that includes regular activity. Where do I find calorie information? The number of calories in a food can be found on a Nutrition Facts label. If a food does not have a Nutrition Facts  label, try to look up the calories online or ask your dietitian for help. Remember that calories are listed per serving. If you choose to have more than one serving of a food, you will have to multiply the calories per serving by the number of servings you plan to eat. For example, the label on a package of bread might say that a serving size is 1 slice and that there are 90 calories in a serving. If you eat 1 slice, you will have eaten 90 calories. If you eat 2 slices, you will have eaten 180 calories.   How do I keep a food log? After each time that you eat, record the following in your food log as soon as possible:  What you ate. Be sure to include toppings, sauces, and other extras on the food.  How much you ate. This can be measured in cups, ounces, or number of items.  How many calories were in each food and drink.  The total number of calories in the food you ate. Keep your food log near you, such as in a pocket-sized notebook or on an app or website on your mobile phone. Some programs will calculate calories for you and show you how many calories you have left to meet your daily goal. What are some portion-control tips?  Know how many calories are in a serving. This will   help you know how many servings you can have of a certain food.  Use a measuring cup to measure serving sizes. You could also try weighing out portions on a kitchen scale. With time, you will be able to estimate serving sizes for some foods.  Take time to put servings of different foods on your favorite plates or in your favorite bowls and cups so you know what a serving looks like.  Try not to eat straight from a food's packaging, such as from a bag or box. Eating straight from the package makes it hard to see how much you are eating and can lead to overeating. Put the amount you would like to eat in a cup or on a plate to make sure you are eating the right portion.  Use smaller plates, glasses, and bowls for smaller  portions and to prevent overeating.  Try not to multitask. For example, avoid watching TV or using your computer while eating. If it is time to eat, sit down at a table and enjoy your food. This will help you recognize when you are full. It will also help you be more mindful of what and how much you are eating. What are tips for following this plan? Reading food labels  Check the calorie count compared with the serving size. The serving size may be smaller than what you are used to eating.  Check the source of the calories. Try to choose foods that are high in protein, fiber, and vitamins, and low in saturated fat, trans fat, and sodium. Shopping  Read nutrition labels while you shop. This will help you make healthy decisions about which foods to buy.  Pay attention to nutrition labels for low-fat or fat-free foods. These foods sometimes have the same number of calories or more calories than the full-fat versions. They also often have added sugar, starch, or salt to make up for flavor that was removed with the fat.  Make a grocery list of lower-calorie foods and stick to it. Cooking  Try to cook your favorite foods in a healthier way. For example, try baking instead of frying.  Use low-fat dairy products. Meal planning  Use more fruits and vegetables. One-half of your plate should be fruits and vegetables.  Include lean proteins, such as chicken, turkey, and fish. Lifestyle Each week, aim to do one of the following:  150 minutes of moderate exercise, such as walking.  75 minutes of vigorous exercise, such as running. General information  Know how many calories are in the foods you eat most often. This will help you calculate calorie counts faster.  Find a way of tracking calories that works for you. Get creative. Try different apps or programs if writing down calories does not work for you. What foods should I eat?  Eat nutritious foods. It is better to have a nutritious,  high-calorie food, such as an avocado, than a food with few nutrients, such as a bag of potato chips.  Use your calories on foods and drinks that will fill you up and will not leave you hungry soon after eating. ? Examples of foods that fill you up are nuts and nut butters, vegetables, lean proteins, and high-fiber foods such as whole grains. High-fiber foods are foods with more than 5 g of fiber per serving.  Pay attention to calories in drinks. Low-calorie drinks include water and unsweetened drinks. The items listed above may not be a complete list of foods and beverages you can eat.   Contact a dietitian for more information.   What foods should I limit? Limit foods or drinks that are not good sources of vitamins, minerals, or protein or that are high in unhealthy fats. These include:  Candy.  Other sweets.  Sodas, specialty coffee drinks, alcohol, and juice. The items listed above may not be a complete list of foods and beverages you should avoid. Contact a dietitian for more information. How do I count calories when eating out?  Pay attention to portions. Often, portions are much larger when eating out. Try these tips to keep portions smaller: ? Consider sharing a meal instead of getting your own. ? If you get your own meal, eat only half of it. Before you start eating, ask for a container and put half of your meal into it. ? When available, consider ordering smaller portions from the menu instead of full portions.  Pay attention to your food and drink choices. Knowing the way food is cooked and what is included with the meal can help you eat fewer calories. ? If calories are listed on the menu, choose the lower-calorie options. ? Choose dishes that include vegetables, fruits, whole grains, low-fat dairy products, and lean proteins. ? Choose items that are boiled, broiled, grilled, or steamed. Avoid items that are buttered, battered, fried, or served with cream sauce. Items labeled as  crispy are usually fried, unless stated otherwise. ? Choose water, low-fat milk, unsweetened iced tea, or other drinks without added sugar. If you want an alcoholic beverage, choose a lower-calorie option, such as a glass of wine or light beer. ? Ask for dressings, sauces, and syrups on the side. These are usually high in calories, so you should limit the amount you eat. ? If you want a salad, choose a garden salad and ask for grilled meats. Avoid extra toppings such as bacon, cheese, or fried items. Ask for the dressing on the side, or ask for olive oil and vinegar or lemon to use as dressing.  Estimate how many servings of a food you are given. Knowing serving sizes will help you be aware of how much food you are eating at restaurants. Where to find more information  Centers for Disease Control and Prevention: www.cdc.gov  [place] Department of Agriculture: myplate.gov Summary  Calorie counting means keeping track of how many calories you eat and drink each day. If you eat fewer calories than your body needs, you should lose weight.  A healthy amount of weight to lose per week is usually 1-2 lb (0.5-0.9 kg). This usually means reducing your daily calorie intake by 500-750 calories.  The number of calories in a food can be found on a Nutrition Facts label. If a food does not have a Nutrition Facts label, try to look up the calories online or ask your dietitian for help.  Use smaller plates, glasses, and bowls for smaller portions and to prevent overeating.  Use your calories on foods and drinks that will fill you up and not leave you hungry shortly after a meal. This information is not intended to replace advice given to you by your health care provider. Make sure you discuss any questions you have with your health care provider. Document Revised: 02/22/2019 Document Reviewed: 02/22/2019 Elsevier Patient Education  2021 Elsevier Inc.  

## 2020-06-05 NOTE — Assessment & Plan Note (Signed)
Uncontrolled He will restart Furosemide, Rx sent to pharmacy Reinforced DASH diet and exercise for weight loss Will recheck BP in 1 month at weight management visit

## 2020-06-05 NOTE — Progress Notes (Signed)
Subjective:    Patient ID: Timothy Beck, male    DOB: [birth year], 40 y.o.   MRN: [id number]  HPI  CVI: He reports persistent swelling in his legs.  He is not currently taking Furosemide but would like to restart this. He denies chronic cough or shortness of breath.  There is no echocardiogram on file.  He would also like to discuss smoking cessation. He is smoking 1 ppd for 20 years. He has failed Wellbutrin in the past, reports it made him hot and he had difficulty sleeping at night. He has not tried the patches or gum in years.  He would also like to discuss weight loss options.  His weight today is 415 pounds with a BMI of 62.96.  He reports he recently cleaned up his diet, no longer eating fast food or consuming sodas.  He was started on Saxenda but admits that he never used this.  His last A1c was 5.6%, 04/2019.  He is not currently exercising.  Of note, his BP today is 155/69.  He reports he has never been diagnosed with hypertension in the past.  He denies headaches, dizziness, visual changes, chest pain or shortness of breath.  He reports he gets a EKG yearly through work which is normal.  Review of Systems  Past Medical History:  Diagnosis Date  . Heartburn     Current Outpatient Medications  Medication Sig Dispense Refill  . buPROPion (WELLBUTRIN SR) 150 MG 12 hr tablet TAKE 1 TABLET(150 MG) BY MOUTH TWICE DAILY 180 tablet 0  . fluticasone (FLONASE) 50 MCG/ACT nasal spray Place 2 sprays into both nostrils daily. 16 g 12  . furosemide (LASIX) 20 MG tablet Take 1 tablet (20 mg total) by mouth daily. 30 tablet 6  . Insulin Pen Needle 31G X 5 MM MISC Use 1 pen needle daily, attached to Saxenda pen (Patient not taking: Reported on 05/17/2019) 100 each 0   No current facility-administered medications for this visit.    No Known Allergies  Family History  Problem Relation Age of Onset  . Diabetes Mother   . Alcohol abuse Father   . Hypertension Daughter     Social History    Socioeconomic History  . Marital status: Single    Spouse name: Not on file  . Number of children: Not on file  . Years of education: Not on file  . Highest education level: Not on file  Occupational History  . Not on file  Tobacco Use  . Smoking status: Current Every Day Smoker    Packs/day: 1.00    Years: 12.00    Pack years: 12.00    Types: Cigarettes  . Smokeless tobacco: Current User    Types: Snuff  Vaping Use  . Vaping Use: Former  Substance and Sexual Activity  . Alcohol use: Yes    Alcohol/week: 4.0 standard drinks    Types: 4 Cans of beer per week  . Drug use: No  . Sexual activity: Not on file  Other Topics Concern  . Not on file  Social History Narrative  . Not on file   Social Determinants of Health   Financial Resource Strain: Not on file  Food Insecurity: Not on file  Transportation Needs: Not on file  Physical Activity: Not on file  Stress: Not on file  Social Connections: Not on file  Intimate Partner Violence: Not on file     Constitutional: Patient reports weight gain.  Denies fever, malaise, fatigue, headache.  HEENT:  Denies eye pain, eye redness, ear pain, ringing in the ears, wax buildup, runny nose, nasal congestion, bloody nose, or sore throat. Respiratory: Denies difficulty breathing, shortness of breath, cough or sputum production.   Cardiovascular: Patient reports swelling in legs.  Denies chest pain, chest tightness, palpitations or swelling in the hands. Neurological: Denies dizziness, difficulty with memory, difficulty with speech or problems with balance and coordination.  Psych: Denies anxiety, depression, SI/HI.  No other specific complaints in a complete review of systems (except as listed in HPI above).      Objective:   Physical Exam   BP (!) 155/69   Pulse 78   Ht 5' 8.1" (1.73 m)   Wt (!) 415 lb 5 oz (188.4 kg)   SpO2 100%   BMI 62.96 kg/m  Wt Readings from Last 3 Encounters:  06/05/20 (!) 415 lb 5 oz (188.4 kg)   05/17/19 (!) 399 lb (181 kg)  04/16/19 (!) 409 lb (185.5 kg)    General: Appears his stated age, obese, in NAD. Skin: Warm, dry and intact.  PVD changes of BLE without ulcerations noted. HEENT: Head: normal shape and size; Eyes: sclera white and EOMs intact;  Cardiovascular: Normal rate and rhythm. S1,S2 noted.  No murmur, rubs or gallops noted.  1+ pitting BLE edema noted. Pulmonary/Chest: Normal effort and positive vesicular breath sounds. No respiratory distress. No wheezes, rales or ronchi noted.  Musculoskeletal: No difficulty with gait.  Neurological: Alert and oriented.  Psychiatric: Mood and affect normal. Behavior is normal. Judgment and thought content normal.    BMET    Component Value Date/Time   NA 142 04/16/2019 1049   NA 141 06/26/2015 0907   K 4.8 04/16/2019 1049   CL 104 04/16/2019 1049   CO2 29 04/16/2019 1049   GLUCOSE 107 04/16/2019 1049   BUN 20 04/16/2019 1049   BUN 15 06/26/2015 0907   CREATININE 0.81 04/16/2019 1049   CALCIUM 9.7 04/16/2019 1049   GFRNONAA 113 04/16/2019 1049   GFRAA 131 04/16/2019 1049    Lipid Panel     Component Value Date/Time   CHOL 158 04/16/2019 1049   CHOL 153 06/26/2015 0907   TRIG 97 04/16/2019 1049   HDL 46 04/16/2019 1049   HDL 45 06/26/2015 0907   CHOLHDL 3.4 04/16/2019 1049   LDLCALC 93 04/16/2019 1049    CBC    Component Value Date/Time   WBC 8.8 04/16/2019 1049   RBC 4.83 04/16/2019 1049   HGB 14.4 04/16/2019 1049   HGB 14.5 06/26/2015 0907   HCT 41.7 04/16/2019 1049   HCT 40.9 06/26/2015 0907   PLT 274 04/16/2019 1049   PLT 236 06/26/2015 0907   MCV 86.3 04/16/2019 1049   MCV 83 06/26/2015 0907   MCH 29.8 04/16/2019 1049   MCHC 34.5 04/16/2019 1049   RDW 14.0 04/16/2019 1049   RDW 13.9 06/26/2015 0907   LYMPHSABS 2,279 04/16/2019 1049   LYMPHSABS 3.0 06/26/2015 0907   MONOABS 0.8 05/29/2017 1608   EOSABS 290 04/16/2019 1049   EOSABS 0.3 06/26/2015 0907   BASOSABS 44 04/16/2019 1049   BASOSABS  0.0 06/26/2015 0907    Hgb A1C Lab Results  Component Value Date   HGBA1C 5.6 04/16/2019           Assessment & Plan:    Nicki Reaper, NP This visit occurred during the SARS-CoV-2 public health emergency.  Safety protocols were in place, including screening questions prior to the visit, additional usage of staff  PPE, and extensive cleaning of exam room while observing appropriate contact time as indicated for disinfecting solutions.

## 2020-06-05 NOTE — Assessment & Plan Note (Signed)
Motivated to quit Discussed Chantix but he would like to avoid this secondary to side effects He may consider retrying Wellbutrin, he does not need a new prescription of this Discussed weaning weekly until no longer smoking Discussed vaping with 0 mg nicotine instead of smoking Will monitor

## 2020-06-06 LAB — COMPREHENSIVE METABOLIC PANEL
AG Ratio: 1.6 (calc) (ref 1.0–2.5)
ALT: 21 U/L (ref 9–46)
AST: 20 U/L (ref 10–40)
Albumin: 4.2 g/dL (ref 3.6–5.1)
Alkaline phosphatase (APISO): 87 U/L (ref 36–130)
BUN: 17 mg/dL (ref 7–25)
CO2: 28 mmol/L (ref 20–32)
Calcium: 9.5 mg/dL (ref 8.6–10.3)
Chloride: 103 mmol/L (ref 98–110)
Creat: 0.93 mg/dL (ref 0.60–1.35)
Globulin: 2.6 g/dL (calc) (ref 1.9–3.7)
Glucose, Bld: 117 mg/dL — ABNORMAL HIGH (ref 65–99)
Potassium: 4.8 mmol/L (ref 3.5–5.3)
Sodium: 139 mmol/L (ref 135–146)
Total Bilirubin: 0.4 mg/dL (ref 0.2–1.2)
Total Protein: 6.8 g/dL (ref 6.1–8.1)

## 2020-06-06 LAB — TSH: TSH: 1.72 mIU/L (ref 0.40–4.50)

## 2020-06-06 LAB — LIPID PANEL
Cholesterol: 150 mg/dL (ref <200)
HDL: 47 mg/dL (ref >=40)
LDL Cholesterol (Calc): 84 mg/dL (calc)
Non-HDL Cholesterol (Calc): 103 mg/dL (calc) (ref <130)
Total CHOL/HDL Ratio: 3.2 (calc) (ref <5.0)
Triglycerides: 92 mg/dL (ref <150)

## 2020-06-06 LAB — HEMOGLOBIN A1C
Hgb A1c MFr Bld: 5.8 % of total Hgb — ABNORMAL HIGH (ref <5.7)
Mean Plasma Glucose: 120 mg/dL
eAG (mmol/L): 6.6 mmol/L

## 2020-06-06 MED ORDER — PEN NEEDLES 31G X 5 MM MISC: 1.0000 | Freq: Every day | 1 refills | Status: DC

## 2020-06-06 MED ORDER — LIRAGLUTIDE 18 MG/3ML ~~LOC~~ SOPN: PEN_INJECTOR | SUBCUTANEOUS | 0 refills | Status: DC

## 2020-06-06 NOTE — Addendum Note (Signed)
Addended by: Lorre Munroe on: 06/06/2020 12:23 PM   Modules accepted: Orders

## 2020-06-13 DIAGNOSIS — M25561 Pain in right knee: Secondary | ICD-10-CM | POA: Diagnosis not present

## 2020-07-03 ENCOUNTER — Other Ambulatory Visit: Payer: Self-pay | Admitting: Orthopaedic Surgery

## 2020-07-03 DIAGNOSIS — M25561 Pain in right knee: Secondary | ICD-10-CM | POA: Diagnosis not present

## 2020-07-07 ENCOUNTER — Telehealth: Payer: Self-pay

## 2020-07-07 ENCOUNTER — Ambulatory Visit: Payer: BC Managed Care – PPO | Admitting: Internal Medicine

## 2020-07-07 NOTE — Telephone Encounter (Signed)
Copied from CRM [id number]. Topic: General - Other >> Jul 07, 2020  8:28 AM Gwenlyn Fudge wrote: Reason for CRM: Pt called stating that he had a friends family member pass away. He states that he is having to drive to [facility] in order to direct funeral. He is requesting to have no show fee waived for his appt this morning. Please advise.

## 2020-07-07 NOTE — Progress Notes (Deleted)
Subjective:    Patient ID: Timothy Beck, male    DOB: 09/13/1980, 40 y.o.   MRN: [id number]  HPI  Patient presents the clinic today for follow-up of chronic venous insufficiency and weight management.  CVI: Managed on Furosemide.  Morbid Obesity: We attempted to send in Victoza for weight management but his insurance would not cover this.  His current weight today is pounds with a BMI of.  Prediabetes: His last A1c was 5.8%.  He is not currently taking any oral diabetic medication at this time.  He does not check his sugars.  Review of Systems     Past Medical History:  Diagnosis Date   Heartburn     Current Outpatient Medications  Medication Sig Dispense Refill   fluticasone (FLONASE) 50 MCG/ACT nasal spray Place 2 sprays into both nostrils daily. 16 g 12   furosemide (LASIX) 20 MG tablet Take 1 tablet (20 mg total) by mouth daily. 90 tablet 0   Insulin Pen Needle (PEN NEEDLES) 31G X 5 MM MISC 1 Device by Does not apply route daily. 90 each 1   liraglutide (VICTOZA) 18 MG/3ML SOPN Inject 0.6 mg into the skin daily for 7 days, THEN 1.2 mg daily for 7 days, THEN 1.8 mg daily for 7 days. 15 mL 0   No current facility-administered medications for this visit.    No Known Allergies  Family History  Problem Relation Age of Onset   Diabetes Mother    Alcohol abuse Father    Hypertension Daughter     Social History   Socioeconomic History   Marital status: Single    Spouse name: Not on file   Number of children: Not on file   Years of education: Not on file   Highest education level: Not on file  Occupational History   Not on file  Tobacco Use   Smoking status: Every Day    Packs/day: 1.00    Years: 12.00    Pack years: 12.00    Types: Cigarettes   Smokeless tobacco: Current    Types: Snuff  Vaping Use   Vaping Use: Former  Substance and Sexual Activity   Alcohol use: Yes    Alcohol/week: 4.0 standard drinks    Types: 4 Cans of beer per week   Drug use: No    Sexual activity: Not on file  Other Topics Concern   Not on file  Social History Narrative   Not on file   Social Determinants of Health   Financial Resource Strain: Not on file  Food Insecurity: Not on file  Transportation Needs: Not on file  Physical Activity: Not on file  Stress: Not on file  Social Connections: Not on file  Intimate Partner Violence: Not on file     Constitutional: Denies fever, malaise, fatigue, headache or abrupt weight changes.  HEENT: Denies eye pain, eye redness, ear pain, ringing in the ears, wax buildup, runny nose, nasal congestion, bloody nose, or sore throat. Respiratory: Denies difficulty breathing, shortness of breath, cough or sputum production.   Cardiovascular: Pt reports swelling in legs. Denies chest pain, chest tightness, palpitations or swelling in the hands.  Gastrointestinal: Denies abdominal pain, bloating, constipation, diarrhea or blood in the stool.  GU: Denies urgency, frequency, pain with urination, burning sensation, blood in urine, odor or discharge. Musculoskeletal: Denies decrease in range of motion, difficulty with gait, muscle pain or joint pain and swelling.  Skin: Denies redness, rashes, lesions or ulcercations.  Neurological: Denies dizziness, difficulty  with memory, difficulty with speech or problems with balance and coordination.  Psych: Denies anxiety, depression, SI/HI.  No other specific complaints in a complete review of systems (except as listed in HPI above).  Objective:   Physical Exam   There were no vitals taken for this visit. Wt Readings from Last 3 Encounters:  06/05/20 (!) 415 lb 5 oz (188.4 kg)  05/17/19 (!) 399 lb (181 kg)  04/16/19 (!) 409 lb (185.5 kg)    General: Appears their stated age, well developed, well nourished in NAD. Skin: Warm, dry and intact. No rashes, lesions or ulcerations noted. HEENT: Head: normal shape and size; Eyes: sclera white, no icterus, conjunctiva pink, PERRLA and EOMs  intact; Ears: Tm's gray and intact, normal light reflex; Nose: mucosa pink and moist, septum midline; Throat/Mouth: Teeth present, mucosa pink and moist, no exudate, lesions or ulcerations noted.  Neck:  Neck supple, trachea midline. No masses, lumps or thyromegaly present.  Cardiovascular: Normal rate and rhythm. S1,S2 noted.  No murmur, rubs or gallops noted. No JVD or BLE edema. No carotid bruits noted. Pulmonary/Chest: Normal effort and positive vesicular breath sounds. No respiratory distress. No wheezes, rales or ronchi noted.  Abdomen: Soft and nontender. Normal bowel sounds. No distention or masses noted. Liver, spleen and kidneys non palpable. Musculoskeletal: Normal range of motion. No signs of joint swelling. No difficulty with gait.  Neurological: Alert and oriented. Cranial nerves II-XII grossly intact. Coordination normal.  Psychiatric: Mood and affect normal. Behavior is normal. Judgment and thought content normal.   EKG:  BMET    Component Value Date/Time   NA 139 06/05/2020 0938   NA 141 06/26/2015 0907   K 4.8 06/05/2020 0938   CL 103 06/05/2020 0938   CO2 28 06/05/2020 0938   GLUCOSE 117 (H) 06/05/2020 0938   BUN 17 06/05/2020 0938   BUN 15 06/26/2015 0907   CREATININE 0.93 06/05/2020 0938   CALCIUM 9.5 06/05/2020 0938   GFRNONAA 113 04/16/2019 1049   GFRAA 131 04/16/2019 1049    Lipid Panel     Component Value Date/Time   CHOL 150 06/05/2020 0938   CHOL 153 06/26/2015 0907   TRIG 92 06/05/2020 0938   HDL 47 06/05/2020 0938   HDL 45 06/26/2015 0907   CHOLHDL 3.2 06/05/2020 0938   LDLCALC 84 06/05/2020 0938    CBC    Component Value Date/Time   WBC 8.8 04/16/2019 1049   RBC 4.83 04/16/2019 1049   HGB 14.4 04/16/2019 1049   HGB 14.5 06/26/2015 0907   HCT 41.7 04/16/2019 1049   HCT 40.9 06/26/2015 0907   PLT 274 04/16/2019 1049   PLT 236 06/26/2015 0907   MCV 86.3 04/16/2019 1049   MCV 83 06/26/2015 0907   MCH 29.8 04/16/2019 1049   MCHC 34.5  04/16/2019 1049   RDW 14.0 04/16/2019 1049   RDW 13.9 06/26/2015 0907   LYMPHSABS 2,279 04/16/2019 1049   LYMPHSABS 3.0 06/26/2015 0907   MONOABS 0.8 05/29/2017 1608   EOSABS 290 04/16/2019 1049   EOSABS 0.3 06/26/2015 0907   BASOSABS 44 04/16/2019 1049   BASOSABS 0.0 06/26/2015 0907    Hgb A1C Lab Results  Component Value Date   HGBA1C 5.8 (H) 06/05/2020           Assessment & Plan:     Nicki Reaper, NP This visit occurred during the SARS-CoV-2 public health emergency.  Safety protocols were in place, including screening questions prior to the visit, additional usage of  staff PPE, and extensive cleaning of exam room while observing appropriate contact time as indicated for disinfecting solutions.

## 2020-07-11 ENCOUNTER — Inpatient Hospital Stay: Admission: RE | Admit: 2020-07-11 | Payer: BLUE CROSS/BLUE SHIELD | Source: Ambulatory Visit

## 2020-07-15 ENCOUNTER — Ambulatory Visit: Payer: BC Managed Care – PPO | Admitting: Internal Medicine

## 2020-07-15 NOTE — Progress Notes (Deleted)
Subjective:    Patient ID: Timothy Beck, male    DOB: [birth year], 40 y.o.   MRN: [id number]  HPI  Patient presents the clinic today for follow-up of leg swelling and weight management.  He was started on Furosemide at his last visit.  He has been taking the medication as prescribed.  He was prescribed Victoza for prediabetes and weight management however this was not covered by his insurance.  Review of Systems  Past Medical History:  Diagnosis Date   Heartburn     Current Outpatient Medications  Medication Sig Dispense Refill   fluticasone (FLONASE) 50 MCG/ACT nasal spray Place 2 sprays into both nostrils daily. 16 g 12   furosemide (LASIX) 20 MG tablet Take 1 tablet (20 mg total) by mouth daily. 90 tablet 0   Insulin Pen Needle (PEN NEEDLES) 31G X 5 MM MISC 1 Device by Does not apply route daily. 90 each 1   liraglutide (VICTOZA) 18 MG/3ML SOPN Inject 0.6 mg into the skin daily for 7 days, THEN 1.2 mg daily for 7 days, THEN 1.8 mg daily for 7 days. 15 mL 0   No current facility-administered medications for this visit.    No Known Allergies  Family History  Problem Relation Age of Onset   Diabetes Mother    Alcohol abuse Father    Hypertension Daughter     Social History   Socioeconomic History   Marital status: Single    Spouse name: Not on file   Number of children: Not on file   Years of education: Not on file   Highest education level: Not on file  Occupational History   Not on file  Tobacco Use   Smoking status: Every Day    Packs/day: 1.00    Years: 12.00    Pack years: 12.00    Types: Cigarettes   Smokeless tobacco: Current    Types: Snuff  Vaping Use   Vaping Use: Former  Substance and Sexual Activity   Alcohol use: Yes    Alcohol/week: 4.0 standard drinks    Types: 4 Cans of beer per week   Drug use: No   Sexual activity: Not on file  Other Topics Concern   Not on file  Social History Narrative   Not on file   Social Determinants of  Health   Financial Resource Strain: Not on file  Food Insecurity: Not on file  Transportation Needs: Not on file  Physical Activity: Not on file  Stress: Not on file  Social Connections: Not on file  Intimate Partner Violence: Not on file     Constitutional: Denies fever, malaise, fatigue, headache or abrupt weight changes.  HEENT: Denies eye pain, eye redness, ear pain, ringing in the ears, wax buildup, runny nose, nasal congestion, bloody nose, or sore throat. Respiratory: Denies difficulty breathing, shortness of breath, cough or sputum production.   Cardiovascular: Patient reports swelling in legs.  Denies chest pain, chest tightness, palpitations or swelling in the hands.  Gastrointestinal: Denies abdominal pain, bloating, constipation, diarrhea or blood in the stool.  GU: Denies urgency, frequency, pain with urination, burning sensation, blood in urine, odor or discharge. Musculoskeletal: Denies decrease in range of motion, difficulty with gait, muscle pain or joint pain and swelling.  Skin: Denies redness, rashes, lesions or ulcercations.  Neurological: Denies dizziness, difficulty with memory, difficulty with speech or problems with balance and coordination.  Psych: Denies anxiety, depression, SI/HI.  No other specific complaints in a complete review of systems (  except as listed in HPI above).     Objective:   Physical Exam   There were no vitals taken for this visit. Wt Readings from Last 3 Encounters:  06/05/20 (!) 415 lb 5 oz (188.4 kg)  05/17/19 (!) 399 lb (181 kg)  04/16/19 (!) 409 lb (185.5 kg)    General: Appears their stated age, well developed, well nourished in NAD. Skin: Warm, dry and intact. No rashes, lesions or ulcerations noted. HEENT: Head: normal shape and size; Eyes: sclera white, no icterus, conjunctiva pink, PERRLA and EOMs intact; Ears: Tm's gray and intact, normal light reflex; Nose: mucosa pink and moist, septum midline; Throat/Mouth: Teeth  present, mucosa pink and moist, no exudate, lesions or ulcerations noted.  Neck:  Neck supple, trachea midline. No masses, lumps or thyromegaly present.  Cardiovascular: Normal rate and rhythm. S1,S2 noted.  No murmur, rubs or gallops noted. No JVD or BLE edema. No carotid bruits noted. Pulmonary/Chest: Normal effort and positive vesicular breath sounds. No respiratory distress. No wheezes, rales or ronchi noted.  Abdomen: Soft and nontender. Normal bowel sounds. No distention or masses noted. Liver, spleen and kidneys non palpable. Musculoskeletal: Normal range of motion. No signs of joint swelling. No difficulty with gait.  Neurological: Alert and oriented. Cranial nerves II-XII grossly intact. Coordination normal.  Psychiatric: Mood and affect normal. Behavior is normal. Judgment and thought content normal.     BMET    Component Value Date/Time   NA 139 06/05/2020 0938   NA 141 06/26/2015 0907   K 4.8 06/05/2020 0938   CL 103 06/05/2020 0938   CO2 28 06/05/2020 0938   GLUCOSE 117 (H) 06/05/2020 0938   BUN 17 06/05/2020 0938   BUN 15 06/26/2015 0907   CREATININE 0.93 06/05/2020 0938   CALCIUM 9.5 06/05/2020 0938   GFRNONAA 113 04/16/2019 1049   GFRAA 131 04/16/2019 1049    Lipid Panel     Component Value Date/Time   CHOL 150 06/05/2020 0938   CHOL 153 06/26/2015 0907   TRIG 92 06/05/2020 0938   HDL 47 06/05/2020 0938   HDL 45 06/26/2015 0907   CHOLHDL 3.2 06/05/2020 0938   LDLCALC 84 06/05/2020 0938    CBC    Component Value Date/Time   WBC 8.8 04/16/2019 1049   RBC 4.83 04/16/2019 1049   HGB 14.4 04/16/2019 1049   HGB 14.5 06/26/2015 0907   HCT 41.7 04/16/2019 1049   HCT 40.9 06/26/2015 0907   PLT 274 04/16/2019 1049   PLT 236 06/26/2015 0907   MCV 86.3 04/16/2019 1049   MCV 83 06/26/2015 0907   MCH 29.8 04/16/2019 1049   MCHC 34.5 04/16/2019 1049   RDW 14.0 04/16/2019 1049   RDW 13.9 06/26/2015 0907   LYMPHSABS 2,279 04/16/2019 1049   LYMPHSABS 3.0  06/26/2015 0907   MONOABS 0.8 05/29/2017 1608   EOSABS 290 04/16/2019 1049   EOSABS 0.3 06/26/2015 0907   BASOSABS 44 04/16/2019 1049   BASOSABS 0.0 06/26/2015 0907    Hgb A1C Lab Results  Component Value Date   HGBA1C 5.8 (H) 06/05/2020           Assessment & Plan:   Nicki Reaper, NP This visit occurred during the SARS-CoV-2 public health emergency.  Safety protocols were in place, including screening questions prior to the visit, additional usage of staff PPE, and extensive cleaning of exam room while observing appropriate contact time as indicated for disinfecting solutions.

## 2020-08-31 ENCOUNTER — Other Ambulatory Visit: Payer: Self-pay | Admitting: Internal Medicine

## 2020-08-31 DIAGNOSIS — I872 Venous insufficiency (chronic) (peripheral): Secondary | ICD-10-CM

## 2020-08-31 NOTE — Telephone Encounter (Signed)
Requested Prescriptions  Pending Prescriptions Disp Refills  . furosemide (LASIX) 20 MG tablet [Pharmacy Med Name: FUROSEMIDE 20MG  TABLETS] 90 tablet 2    Sig: TAKE 1 TABLET(20 MG) BY MOUTH DAILY     Cardiovascular:  Diuretics - Loop Failed - 08/31/2020  9:36 AM      Failed - Last BP in normal range    BP Readings from Last 1 Encounters:  06/05/20 (!) 155/69         Passed - K in normal range and within 360 days    Potassium  Date Value Ref Range Status  06/05/2020 4.8 3.5 - 5.3 mmol/L Final         Passed - Ca in normal range and within 360 days    Calcium  Date Value Ref Range Status  06/05/2020 9.5 8.6 - 10.3 mg/dL Final         Passed - Na in normal range and within 360 days    Sodium  Date Value Ref Range Status  06/05/2020 139 135 - 146 mmol/L Final  06/26/2015 141 134 - 144 mmol/L Final         Passed - Cr in normal range and within 360 days    Creat  Date Value Ref Range Status  06/05/2020 0.93 0.60 - 1.35 mg/dL Final         Passed - Valid encounter within last 6 months    Recent Outpatient Visits          2 months ago Morbid obesity (HCC)   [facility] [place], [place], NP   1 year ago Microscopic hematuria   [facility], [facility], FNP   1 year ago Class 3 severe obesity due to excess calories without serious comorbidity with body mass index (BMI) of 50.0 to 59.9 in adult [facility])   [facility], [facility], FNP   4 years ago Class 3 obesity due to excess calories without serious comorbidity in adult, unspecified BMI   [facility], Juanetta Gosling., MD   4 years ago Obesity   [facility]., MD

## 2020-11-18 ENCOUNTER — Ambulatory Visit
Admission: RE | Admit: 2020-11-18 | Discharge: 2020-11-18 | Disposition: A | Discharge status: Home / Self Care | Payer: Self-pay | Attending: Physician Assistant | Admitting: Physician Assistant

## 2020-11-18 ENCOUNTER — Other Ambulatory Visit: Payer: Self-pay | Admitting: Physician Assistant

## 2020-11-18 ENCOUNTER — Other Ambulatory Visit: Payer: Self-pay

## 2020-11-18 ENCOUNTER — Ambulatory Visit
Admission: RE | Admit: 2020-11-18 | Discharge: 2020-11-18 | Disposition: A | Discharge status: Home / Self Care | Payer: Self-pay | Source: Ambulatory Visit | Attending: Physician Assistant | Admitting: Physician Assistant

## 2020-11-18 DIAGNOSIS — Z0000 Encounter for general adult medical examination without abnormal findings: Secondary | ICD-10-CM

## 2021-05-07 ENCOUNTER — Telehealth: Payer: Self-pay | Admitting: Internal Medicine

## 2021-05-07 ENCOUNTER — Other Ambulatory Visit: Payer: Self-pay | Admitting: Internal Medicine

## 2021-05-07 MED ORDER — OZEMPIC (0.25 OR 0.5 MG/DOSE) 2 MG/1.5ML ~~LOC~~ SOPN: 0.2500 mg | PEN_INJECTOR | SUBCUTANEOUS | 3 refills | Status: DC

## 2021-05-07 NOTE — Telephone Encounter (Signed)
Apt 05/14/2021 at 1:20 ? ?Thanks,  ? ?-Mickel Baas  ?

## 2021-05-07 NOTE — Telephone Encounter (Signed)
I will send in but he needs to schedule an appt for his annual exam ?

## 2021-05-07 NOTE — Telephone Encounter (Signed)
Pt reports that he wants to begin Ozempic per discussion with PCP, his insurance now approves. Please advise  ?

## 2021-05-14 ENCOUNTER — Encounter: Payer: Self-pay | Admitting: Internal Medicine

## 2021-05-14 ENCOUNTER — Ambulatory Visit (INDEPENDENT_AMBULATORY_CARE_PROVIDER_SITE_OTHER): Payer: BC Managed Care – PPO | Admitting: Internal Medicine

## 2021-05-14 ENCOUNTER — Telehealth: Payer: Self-pay

## 2021-05-14 DIAGNOSIS — R7303 Prediabetes: Secondary | ICD-10-CM

## 2021-05-14 MED ORDER — FUROSEMIDE 40 MG PO TABS: 40.0000 mg | ORAL_TABLET | Freq: Every day | ORAL | 1 refills | Status: DC

## 2021-05-14 MED ORDER — SEMAGLUTIDE-WEIGHT MANAGEMENT 0.25 MG/0.5ML ~~LOC~~ SOAJ: 0.2500 mg | SUBCUTANEOUS | 0 refills | Status: DC

## 2021-05-14 NOTE — Assessment & Plan Note (Signed)
Insurance will not cover Ozempic or Saxenda ?We will trial Wegovy ?Encourage low-carb, calorie restricted diet and exercise for weight loss ?

## 2021-05-14 NOTE — Telephone Encounter (Signed)
Completed PA on [email or web address]. ? ?Silvio Clayman (Key: [id number]) ?Ozempic (0.25 or 0.5 MG/DOSE) 2MG /1.5ML pen-injectors ? ?Awaiting insurance response. ?

## 2021-05-14 NOTE — Progress Notes (Signed)
? ?Subjective:  ? ? Patient ID: Timothy Beck, male    DOB: [birth year], 41 y.o.   MRN: [id number] ? ?HPI ? ?Patient presents to clinic today for his annual exam. ? ?Flu: 11/2020 ?Tetanus:  < 10 years ago ?COVID: Pfizer x1 ?Vision screening: as needed ?Dentist: biannually ? ?Diet: He does eat meat. He consumes some fruits and veggies. He tries to avoid fried foods. He drinks mostly Monsters. ?Exercise: Going to gym, cardio and weights 4 days a week ? ?Review of Systems ? ?   ?Past Medical History:  ?Diagnosis Date  ? Heartburn   ? ? ?Current Outpatient Medications  ?Medication Sig Dispense Refill  ? fluticasone (FLONASE) 50 MCG/ACT nasal spray Place 2 sprays into both nostrils daily. 16 g 12  ? furosemide (LASIX) 20 MG tablet TAKE 1 TABLET(20 MG) BY MOUTH DAILY 90 tablet 2  ? Insulin Pen Needle (PEN NEEDLES) 31G X 5 MM MISC 1 Device by Does not apply route daily. 90 each 1  ? liraglutide (VICTOZA) 18 MG/3ML SOPN Inject 0.6 mg into the skin daily for 7 days, THEN 1.2 mg daily for 7 days, THEN 1.8 mg daily for 7 days. 15 mL 0  ? OZEMPIC, 0.25 OR 0.5 MG/DOSE, 2 MG/1.5ML SOPN Inject 0.25 mg into the skin once a week. For first 4 weeks. Then increase dose to 0.5mg  weekly 1.5 mL 3  ? ?No current facility-administered medications for this visit.  ? ? ?No Known Allergies ? ?Family History  ?Problem Relation Age of Onset  ? Diabetes Mother   ? Alcohol abuse Father   ? Hypertension Daughter   ? ? ?Social History  ? ?Socioeconomic History  ? Marital status: Single  ?  Spouse name: Not on file  ? Number of children: Not on file  ? Years of education: Not on file  ? Highest education level: Not on file  ?Occupational History  ? Not on file  ?Tobacco Use  ? Smoking status: Every Day  ?  Packs/day: 1.00  ?  Years: 12.00  ?  Pack years: 12.00  ?  Types: Cigarettes  ? Smokeless tobacco: Current  ?  Types: Snuff  ?Vaping Use  ? Vaping Use: Former  ?Substance and Sexual Activity  ? Alcohol use: Yes  ?  Alcohol/week: 4.0 standard drinks   ?  Types: 4 Cans of beer per week  ? Drug use: No  ? Sexual activity: Not on file  ?Other Topics Concern  ? Not on file  ?Social History Narrative  ? Not on file  ? ?Social Determinants of Health  ? ?Financial Resource Strain: Not on file  ?Food Insecurity: Not on file  ?Transportation Needs: Not on file  ?Physical Activity: Not on file  ?Stress: Not on file  ?Social Connections: Not on file  ?Intimate Partner Violence: Not on file  ? ? ? ?Constitutional: Denies fever, malaise, fatigue, headache or abrupt weight changes.  ?HEENT: Denies eye pain, eye redness, ear pain, ringing in the ears, wax buildup, runny nose, nasal congestion, bloody nose, or sore throat. ?Respiratory: Denies difficulty breathing, shortness of breath, cough or sputum production.   ?Cardiovascular: Patient reports swelling in his lower extremities.  Denies chest pain, chest tightness, palpitations or swelling in the hands.  ?Gastrointestinal: Denies abdominal pain, bloating, constipation, diarrhea or blood in the stool.  ?GU: Denies urgency, frequency, pain with urination, burning sensation, blood in urine, odor or discharge. ?Musculoskeletal: Denies decrease in range of motion, difficulty with gait, muscle pain or  joint pain and swelling.  ?Skin: Denies redness, rashes, lesions or ulcercations.  ?Neurological: Denies dizziness, difficulty with memory, difficulty with speech or problems with balance and coordination.  ?Psych: Denies anxiety, depression, SI/HI. ? ?No other specific complaints in a complete review of systems (except as listed in HPI above). ? ?Objective:  ? Physical Exam ? ?BP 128/78 (BP Location: Left Arm, Patient Position: Sitting, Cuff Size: Large)   Pulse 83   Temp (!) 97.1 ?F (36.2 ?C) (Temporal)   Ht 5\' 10"  (1.778 m)   Wt (!) 404 lb (183.3 kg)   SpO2 99%   BMI 57.97 kg/m?  ? ?Wt Readings from Last 3 Encounters:  ?06/05/20 (!) 415 lb 5 oz (188.4 kg)  ?05/17/19 (!) 399 lb (181 kg)  ?04/16/19 (!) 409 lb (185.5 kg)   ? ? ?General: Appears his stated age, obese, in NAD. ?Skin: Warm, dry and intact. ?HEENT: Head: normal shape and size; Eyes: sclera white, no icterus, conjunctiva pink, PERRLA and EOMs intact;  ?Neck:  Neck supple, trachea midline. No masses, lumps or thyromegaly present.  ?Cardiovascular: Normal rate and rhythm. S1,S2 noted.  No murmur, rubs or gallops noted.  Trace pitting BLE edema. No carotid bruits noted. ?Pulmonary/Chest: Normal effort and positive vesicular breath sounds. No respiratory distress. No wheezes, rales or ronchi noted.  ?Abdomen: Soft and nontender. Normal bowel sounds. ?Musculoskeletal: Strength 5/5 BUE/BLE.  No difficulty with gait.  ?Neurological: Alert and oriented. Cranial nerves II-XII grossly intact. Coordination normal.  ?Psychiatric: Mood and affect normal. Behavior is normal. Judgment and thought content normal.  ? ? ?BMET ?   ?Component Value Date/Time  ? NA 139 06/05/2020 0938  ? NA 141 06/26/2015 0907  ? K 4.8 06/05/2020 0938  ? CL 103 06/05/2020 0938  ? CO2 28 06/05/2020 0938  ? GLUCOSE 117 (H) 06/05/2020 19140938  ? BUN 17 06/05/2020 0938  ? BUN 15 06/26/2015 0907  ? CREATININE 0.93 06/05/2020 0938  ? CALCIUM 9.5 06/05/2020 0938  ? GFRNONAA 113 04/16/2019 1049  ? GFRAA 131 04/16/2019 1049  ? ? ?Lipid Panel  ?   ?Component Value Date/Time  ? CHOL 150 06/05/2020 0938  ? CHOL 153 06/26/2015 0907  ? TRIG 92 06/05/2020 0938  ? HDL 47 06/05/2020 0938  ? HDL 45 06/26/2015 0907  ? CHOLHDL 3.2 06/05/2020 0938  ? LDLCALC 84 06/05/2020 0938  ? ? ?CBC ?   ?Component Value Date/Time  ? WBC 8.8 04/16/2019 1049  ? RBC 4.83 04/16/2019 1049  ? HGB 14.4 04/16/2019 1049  ? HGB 14.5 06/26/2015 0907  ? HCT 41.7 04/16/2019 1049  ? HCT 40.9 06/26/2015 0907  ? PLT 274 04/16/2019 1049  ? PLT 236 06/26/2015 0907  ? MCV 86.3 04/16/2019 1049  ? MCV 83 06/26/2015 0907  ? MCH 29.8 04/16/2019 1049  ? MCHC 34.5 04/16/2019 1049  ? RDW 14.0 04/16/2019 1049  ? RDW 13.9 06/26/2015 0907  ? LYMPHSABS 2,279 04/16/2019 1049   ? LYMPHSABS 3.0 06/26/2015 0907  ? MONOABS 0.8 05/29/2017 1608  ? EOSABS 290 04/16/2019 1049  ? EOSABS 0.3 06/26/2015 0907  ? BASOSABS 44 04/16/2019 1049  ? BASOSABS 0.0 06/26/2015 0907  ? ? ?Hgb A1C ?Lab Results  ?Component Value Date  ? HGBA1C 5.8 (H) 06/05/2020  ? ? ? ? ? ? ? ?   ?Assessment & Plan:  ? ?Preventative Health Maintenance: ? ?Encouraged him to get a flu shot the fall ?Tetanus UTD per his report ?Encouraged him to get his COVID  booster ?Encouraged him to consume a balanced diet and exercise regimen ?Advised him to see an eye doctor and dentist annually ?He reports he just had labs at [facility], will provide me with a copy of these labs ? ?RTC in 6 months, follow-up chronic conditions ?Nicki Reaper, NP ? ?

## 2021-05-18 NOTE — Telephone Encounter (Signed)
Authorization was not needed per [employer]. ?

## 2021-12-01 ENCOUNTER — Other Ambulatory Visit: Payer: Self-pay | Admitting: Internal Medicine

## 2021-12-01 NOTE — Telephone Encounter (Signed)
Requested medication (s) are due for refill today: yes  Requested medication (s) are on the active medication list: yes    Last refill: 05/14/21  #90 1 refill  Future visit scheduled no  Notes to clinic: Failed due to labs, please review. Thank you.  Requested Prescriptions  Pending Prescriptions Disp Refills   furosemide (LASIX) 40 MG tablet [Pharmacy Med Name: FUROSEMIDE 40MG  TABLETS] 90 tablet 1    Sig: TAKE 1 TABLET(40 MG) BY MOUTH DAILY     Cardiovascular:  Diuretics - Loop Failed - 12/01/2021  7:16 AM      Failed - K in normal range and within 180 days    Potassium  Date Value Ref Range Status  06/05/2020 4.8 3.5 - 5.3 mmol/L Final         Failed - Ca in normal range and within 180 days    Calcium  Date Value Ref Range Status  06/05/2020 9.5 8.6 - 10.3 mg/dL Final         Failed - Na in normal range and within 180 days    Sodium  Date Value Ref Range Status  06/05/2020 139 135 - 146 mmol/L Final  06/26/2015 141 134 - 144 mmol/L Final         Failed - Cr in normal range and within 180 days    Creat  Date Value Ref Range Status  06/05/2020 0.93 0.60 - 1.35 mg/dL Final         Failed - Cl in normal range and within 180 days    Chloride  Date Value Ref Range Status  06/05/2020 103 98 - 110 mmol/L Final         Failed - Mg Level in normal range and within 180 days    No results found for: "MG"       Failed - Valid encounter within last 6 months    Recent Outpatient Visits           6 months ago Prediabetes   [facility] [place], Coralie Keens, NP   1 year ago Morbid obesity [facility])   [facility], NP   2 years ago Microscopic hematuria   [facility], Lupita Raider, FNP   2 years ago Class 3 severe obesity due to excess calories without serious comorbidity with body mass index (BMI) of 50.0 to 59.9 in adult [facility])   [facility], Lupita Raider, FNP   6 years ago Class 3 obesity due to  excess calories without serious comorbidity in adult, unspecified BMI   [facility] Arlis Porta., MD              Passed - Last BP in normal range    BP Readings from Last 1 Encounters:  05/14/21 128/78

## 2022-07-09 IMAGING — CR DG CHEST 1V
1 series · 1 of 1 positions shown · non-contrast
Comparison: Chest x-ray report 12/30/1997.

CLINICAL DATA: Routine physical.

EXAM:
CHEST  1 VIEW

[dg chest 1 view]
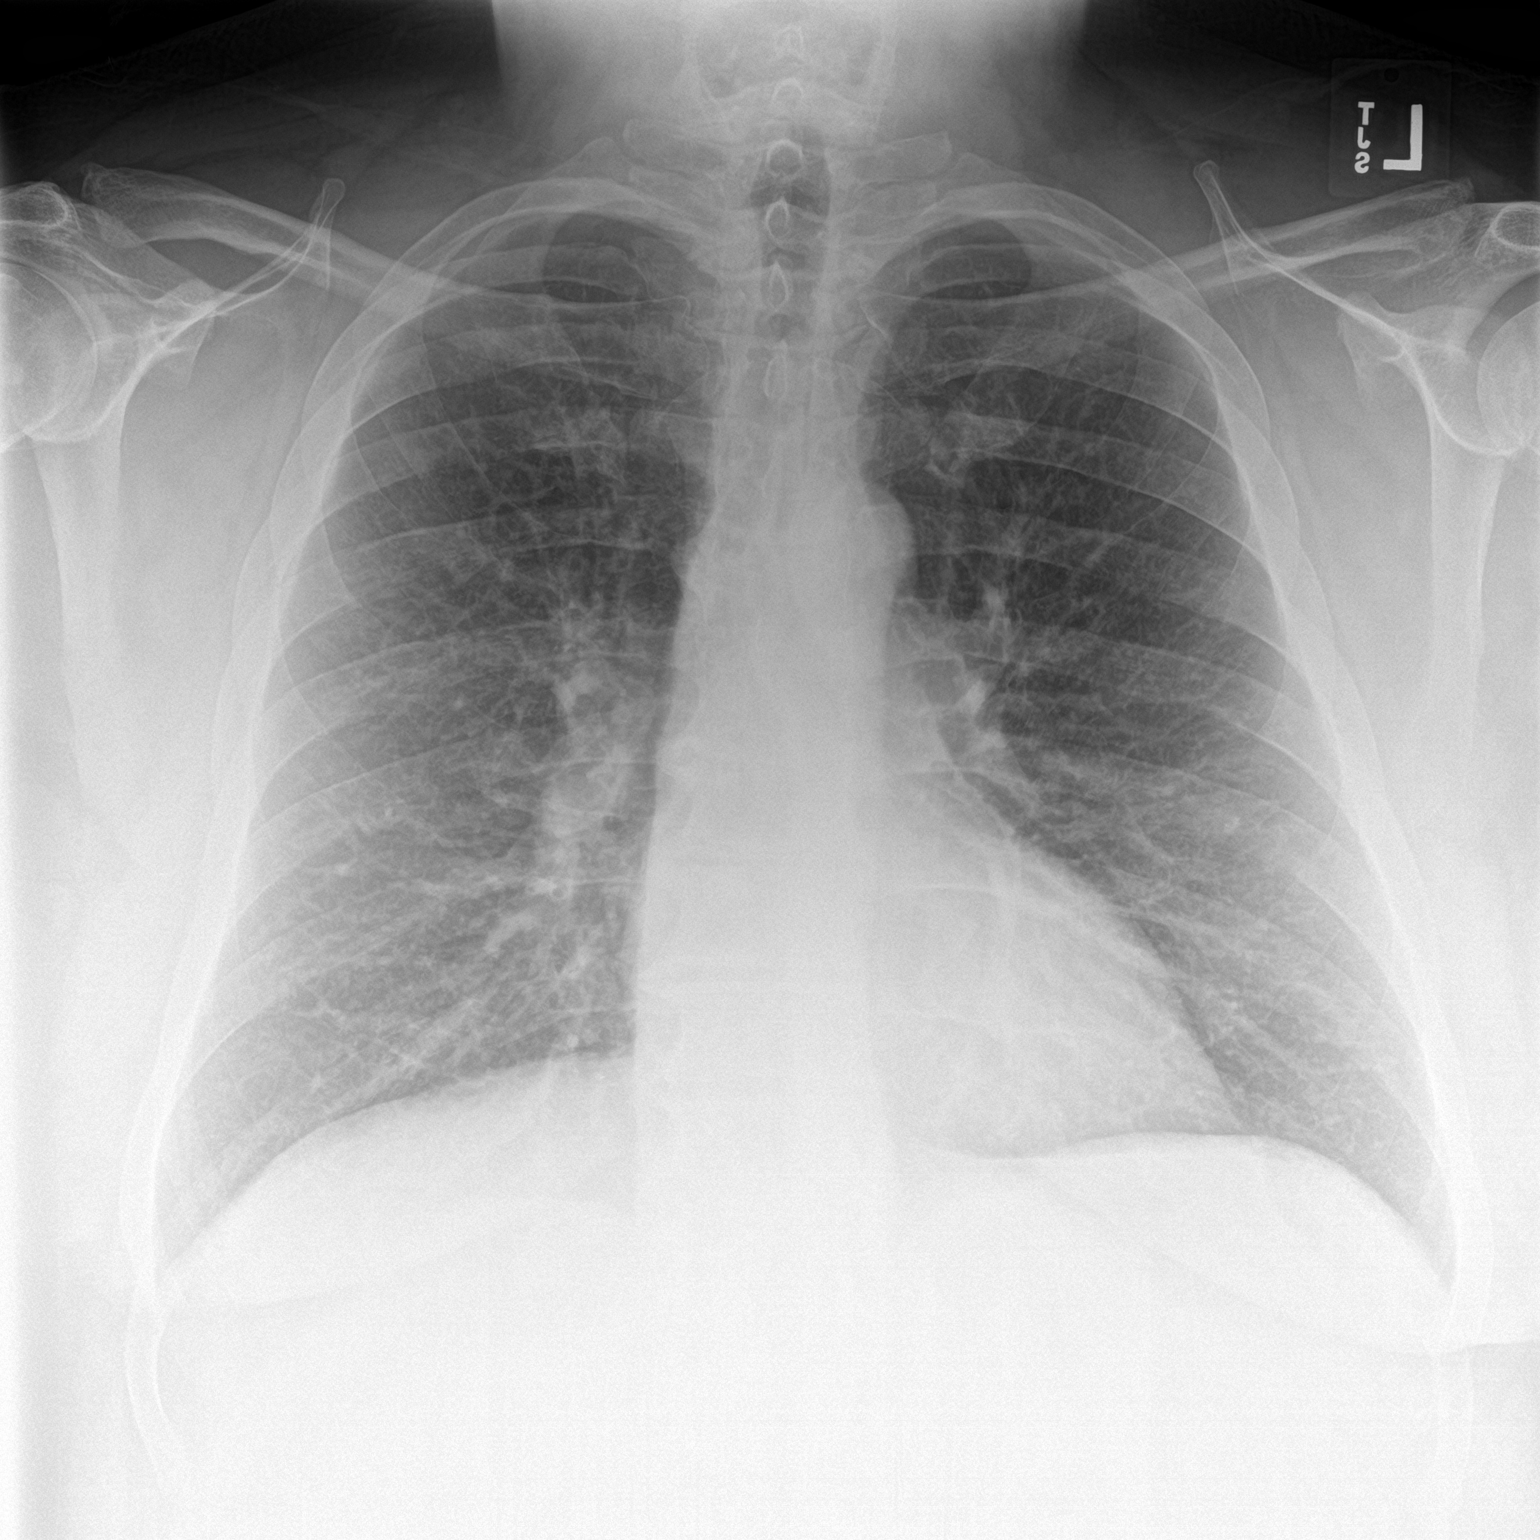

[1 of 1 positions shown; findings below may reference images not displayed]

FINDINGS: Mediastinum and hilar structures normal. Heart size normal. Mild
bilateral interstitial prominence cannot be excluded. An active
interstitial process cannot be completely excluded. No pleural
effusion or pneumothorax. Degenerative change and mild scoliosis
thoracic spine.
IMPRESSION: 1. Mild bilateral interstitial prominence cannot be excluded. Active
interstitial process cannot be completely excluded.

2.  Degenerative change mild scoliosis thoracic spine.

## 2022-11-05 LAB — LAB REPORT - SCANNED: EGFR: 114

## 2022-11-25 ENCOUNTER — Ambulatory Visit: Payer: BC Managed Care – PPO | Admitting: Internal Medicine

## 2022-11-25 DIAGNOSIS — R7303 Prediabetes: Secondary | ICD-10-CM | POA: Diagnosis not present

## 2022-11-25 MED ORDER — TIRZEPATIDE-WEIGHT MANAGEMENT 10 MG/0.5ML ~~LOC~~ SOAJ: 10.0000 mg | SUBCUTANEOUS | 0 refills | Status: DC

## 2022-11-25 MED ORDER — FUROSEMIDE 40 MG PO TABS: 40.0000 mg | ORAL_TABLET | Freq: Every day | ORAL | 1 refills | Status: DC

## 2022-11-25 NOTE — Patient Instructions (Signed)
Health Maintenance, Male Adopting a healthy lifestyle and getting preventive care are important in promoting health and wellness. Ask your health care provider about: The right schedule for you to have regular tests and exams. Things you can do on your own to prevent diseases and keep yourself healthy. What should I know about diet, weight, and exercise? Eat a healthy diet  Eat a diet that includes plenty of vegetables, fruits, low-fat dairy products, and lean protein. Do not eat a lot of foods that are high in solid fats, added sugars, or sodium. Maintain a healthy weight Body mass index (BMI) is a measurement that can be used to identify possible weight problems. It estimates body fat based on height and weight. Your health care provider can help determine your BMI and help you achieve or maintain a healthy weight. Get regular exercise Get regular exercise. This is one of the most important things you can do for your health. Most adults should: Exercise for at least 150 minutes each week. The exercise should increase your heart rate and make you sweat (moderate-intensity exercise). Do strengthening exercises at least twice a week. This is in addition to the moderate-intensity exercise. Spend less time sitting. Even light physical activity can be beneficial. Watch cholesterol and blood lipids Have your blood tested for lipids and cholesterol at 41 years of age, then have this test every 5 years. You may need to have your cholesterol levels checked more often if: Your lipid or cholesterol levels are high. You are older than 40 years of age. You are at high risk for heart disease. What should I know about cancer screening? Many types of cancers can be detected early and may often be prevented. Depending on your health history and family history, you may need to have cancer screening at various ages. This may include screening for: Colorectal cancer. Prostate cancer. Skin cancer. Lung  cancer. What should I know about heart disease, diabetes, and high blood pressure? Blood pressure and heart disease High blood pressure causes heart disease and increases the risk of stroke. This is more likely to develop in people who have high blood pressure readings or are overweight. Talk with your health care provider about your target blood pressure readings. Have your blood pressure checked: Every 3-5 years if you are 18-39 years of age. Every year if you are 40 years old or older. If you are between the ages of 65 and 75 and are a current or former smoker, ask your health care provider if you should have a one-time screening for abdominal aortic aneurysm (AAA). Diabetes Have regular diabetes screenings. This checks your fasting blood sugar level. Have the screening done: Once every three years after age 45 if you are at a normal weight and have a low risk for diabetes. More often and at a younger age if you are overweight or have a high risk for diabetes. What should I know about preventing infection? Hepatitis B If you have a higher risk for hepatitis B, you should be screened for this virus. Talk with your health care provider to find out if you are at risk for hepatitis B infection. Hepatitis C Blood testing is recommended for: Everyone born from 1945 through 1965. Anyone with known risk factors for hepatitis C. Sexually transmitted infections (STIs) You should be screened each year for STIs, including gonorrhea and chlamydia, if: You are sexually active and are younger than 41 years of age. You are older than 41 years of age and your   health care provider tells you that you are at risk for this type of infection. Your sexual activity has changed since you were last screened, and you are at increased risk for chlamydia or gonorrhea. Ask your health care provider if you are at risk. Ask your health care provider about whether you are at high risk for HIV. Your health care provider  may recommend a prescription medicine to help prevent HIV infection. If you choose to take medicine to prevent HIV, you should first get tested for HIV. You should then be tested every 3 months for as long as you are taking the medicine. Follow these instructions at home: Alcohol use Do not drink alcohol if your health care provider tells you not to drink. If you drink alcohol: Limit how much you have to 0-2 drinks a day. Know how much alcohol is in your drink. In the [place] one drink equals one 12 oz bottle of beer (355 mL), one 5 oz glass of wine (148 mL), or one 1 oz glass of hard liquor (44 mL). Lifestyle Do not use any products that contain nicotine or tobacco. These products include cigarettes, chewing tobacco, and vaping devices, such as e-cigarettes. If you need help quitting, ask your health care provider. Do not use street drugs. Do not share needles. Ask your health care provider for help if you need support or information about quitting drugs. General instructions Schedule regular health, dental, and eye exams. Stay current with your vaccines. Tell your health care provider if: You often feel depressed. You have ever been abused or do not feel safe at home. Summary Adopting a healthy lifestyle and getting preventive care are important in promoting health and wellness. Follow your health care provider's instructions about healthy diet, exercising, and getting tested or screened for diseases. Follow your health care provider's instructions on monitoring your cholesterol and blood pressure. This information is not intended to replace advice given to you by your health care provider. Make sure you discuss any questions you have with your health care provider. Document Revised: 06/02/2020 Document Reviewed: 06/02/2020 Elsevier Patient Education  2024 Elsevier Inc.  

## 2022-11-25 NOTE — Progress Notes (Signed)
Subjective:    Patient ID: Timothy Beck, male    DOB: 09/22/1980, 42 y.o.   MRN: [id number]  HPI  Patient presents to clinic today for his annual exam.  Flu: 11/2015 Tetanus: < 10 years ago COVID: X 1 Vision screening:  annually Dentist: biannually  Diet: He does eat meat. He consumes fruits and veggies. He tries to avoid fried foods. He drinks mostly water, juice, beer Exercise: Circuit training 4-5 times per week  Review of Systems     Past Medical History:  Diagnosis Date   Heartburn     Current Outpatient Medications  Medication Sig Dispense Refill   furosemide (LASIX) 40 MG tablet Take 1 tablet (40 mg total) by mouth daily. 90 tablet 1   Insulin Pen Needle (PEN NEEDLES) 31G X 5 MM MISC 1 Device by Does not apply route daily. 90 each 1   liraglutide (VICTOZA) 18 MG/3ML SOPN Inject 0.6 mg into the skin daily for 7 days, THEN 1.2 mg daily for 7 days, THEN 1.8 mg daily for 7 days. 15 mL 0   Semaglutide-Weight Management 0.25 MG/0.5ML SOAJ Inject 0.25 mg into the skin once a week. 2 mL 0   No current facility-administered medications for this visit.    No Known Allergies  Family History  Problem Relation Age of Onset   Diabetes Mother    Alcohol abuse Father    Hypertension Daughter    Colon cancer Neg Hx    Prostate cancer Neg Hx     Social History   Socioeconomic History   Marital status: Single    Spouse name: Not on file   Number of children: Not on file   Years of education: Not on file   Highest education level: Associate degree: academic program  Occupational History   Not on file  Tobacco Use   Smoking status: Every Day    Current packs/day: 1.00    Average packs/day: 1 pack/day for 12.0 years (12.0 ttl pk-yrs)    Types: Cigarettes   Smokeless tobacco: Former    Types: Snuff  Vaping Use   Vaping status: Former  Substance and Sexual Activity   Alcohol use: Yes    Alcohol/week: 4.0 standard drinks of alcohol    Types: 4 Cans of beer per  week   Drug use: No   Sexual activity: Not on file  Other Topics Concern   Not on file  Social History Narrative   Not on file   Social Determinants of Health   Financial Resource Strain: Low Risk  (11/25/2022)   Overall Financial Resource Strain (CARDIA)    Difficulty of Paying Living Expenses: Not hard at all  Food Insecurity: No Food Insecurity (11/25/2022)   Hunger Vital Sign    Worried About Running Out of Food in the Last Year: Never true    Ran Out of Food in the Last Year: Never true  Transportation Needs: No Transportation Needs (11/25/2022)   PRAPARE - Administrator, Civil Service (Medical): No    Lack of Transportation (Non-Medical): No  Physical Activity: Sufficiently Active (11/25/2022)   Exercise Vital Sign    Days of Exercise per Week: 4 days    Minutes of Exercise per Session: 40 min  Stress: No Stress Concern Present (11/25/2022)   [employer] of Occupational Health - Occupational Stress Questionnaire    Feeling of Stress : Only a little  Social Connections: Moderately Integrated (11/25/2022)   Social Connection and Isolation Panel [NHANES]  Frequency of Communication with Friends and Family: Twice a week    Frequency of Social Gatherings with Friends and Family: Once a week    Attends Religious Services: More than 4 times per year    Active Member of [employer] or Organizations: Yes    Attends Engineer, structural: More than 4 times per year    Marital Status: Never married  Intimate Partner Violence: Not on file     Constitutional: Denies fever, malaise, fatigue, headache or abrupt weight changes.  HEENT: Denies eye pain, eye redness, ear pain, ringing in the ears, wax buildup, runny nose, nasal congestion, bloody nose, or sore throat. Respiratory: Denies difficulty breathing, shortness of breath, cough or sputum production.   Cardiovascular: Patient reports intermittent swelling in legs.  Denies chest pain, chest tightness,  palpitations or swelling in the hands.  Gastrointestinal: Denies abdominal pain, bloating, constipation, diarrhea or blood in the stool.  GU: Denies urgency, frequency, pain with urination, burning sensation, blood in urine, odor or discharge. Musculoskeletal: Denies decrease in range of motion, difficulty with gait, muscle pain or joint pain and swelling.  Skin: Denies redness, rashes, lesions or ulcercations.  Neurological: Denies dizziness, difficulty with memory, difficulty with speech or problems with balance and coordination.  Psych: Denies anxiety, depression, SI/HI.  No other specific complaints in a complete review of systems (except as listed in HPI above).  Objective:   Physical Exam  BP 108/68   Pulse 77   Ht 5\' 10"  (1.778 m)   Wt (!) 369 lb 8 oz (167.6 kg)   SpO2 95%   BMI 53.02 kg/m   Wt Readings from Last 3 Encounters:  05/14/21 (!) 404 lb (183.3 kg)  06/05/20 (!) 415 lb 5 oz (188.4 kg)  05/17/19 (!) 399 lb (181 kg)    General: Appears his stated age, obese, in NAD. Skin: Warm, dry and intact.  HEENT: Head: normal shape and size; Eyes: sclera white, no icterus, conjunctiva pink, PERRLA and EOMs intact;  Neck:  Neck supple, trachea midline. No masses, lumps or thyromegaly present.  Cardiovascular: Normal rate and rhythm. S1,S2 noted.  No murmur, rubs or gallops noted. No JVD. Trace pitting BLE edema.  Pulmonary/Chest: Normal effort and positive vesicular breath sounds. No respiratory distress. No wheezes, rales or ronchi noted.  Abdomen: Soft and nontender. Normal bowel sounds.  Musculoskeletal: Strength 5/5 BUE/BLE. No difficulty with gait.  Neurological: Alert and oriented. Cranial nerves II-XII grossly intact. Coordination normal.  Psychiatric: Mood and affect normal. Behavior is normal. Judgment and thought content normal.    BMET    Component Value Date/Time   NA 139 06/05/2020 0938   NA 141 06/26/2015 0907   K 4.8 06/05/2020 0938   CL 103 06/05/2020  0938   CO2 28 06/05/2020 0938   GLUCOSE 117 (H) 06/05/2020 0938   BUN 17 06/05/2020 0938   BUN 15 06/26/2015 0907   CREATININE 0.93 06/05/2020 0938   CALCIUM 9.5 06/05/2020 0938   GFRNONAA 113 04/16/2019 1049   GFRAA 131 04/16/2019 1049    Lipid Panel     Component Value Date/Time   CHOL 150 06/05/2020 0938   CHOL 153 06/26/2015 0907   TRIG 92 06/05/2020 0938   HDL 47 06/05/2020 0938   HDL 45 06/26/2015 0907   CHOLHDL 3.2 06/05/2020 0938   LDLCALC 84 06/05/2020 0938    CBC    Component Value Date/Time   WBC 8.8 04/16/2019 1049   RBC 4.83 04/16/2019 1049   HGB  14.4 04/16/2019 1049   HGB 14.5 06/26/2015 0907   HCT 41.7 04/16/2019 1049   HCT 40.9 06/26/2015 0907   PLT 274 04/16/2019 1049   PLT 236 06/26/2015 0907   MCV 86.3 04/16/2019 1049   MCV 83 06/26/2015 0907   MCH 29.8 04/16/2019 1049   MCHC 34.5 04/16/2019 1049   RDW 14.0 04/16/2019 1049   RDW 13.9 06/26/2015 0907   LYMPHSABS 2,279 04/16/2019 1049   LYMPHSABS 3.0 06/26/2015 0907   MONOABS 0.8 05/29/2017 1608   EOSABS 290 04/16/2019 1049   EOSABS 0.3 06/26/2015 0907   BASOSABS 44 04/16/2019 1049   BASOSABS 0.0 06/26/2015 0907    Hgb A1C Lab Results  Component Value Date   HGBA1C 5.8 (H) 06/05/2020            Assessment & Plan:   Preventative health maintenance:  Flu shot declined Tetanus UTD per his report Encouraged him to get his COVID booster Encouraged him to consume a balanced diet and exercise regimen Advised him to see an eye doctor and dentist annually A1c and hep C today  RTC in 6 months, follow-up chronic conditions Nicki Reaper, NP

## 2022-11-25 NOTE — Assessment & Plan Note (Signed)
Encouraged diet and exercise for weight loss ?

## 2022-11-26 LAB — HEMOGLOBIN A1C
Hgb A1c MFr Bld: 5.7 %{Hb} — ABNORMAL HIGH (ref <5.7)
Mean Plasma Glucose: 117 mg/dL
eAG (mmol/L): 6.5 mmol/L

## 2022-11-30 ENCOUNTER — Telehealth: Payer: Self-pay

## 2022-11-30 NOTE — Telephone Encounter (Signed)
Patient would like to try semaglutide, he started the full dose today, instead of zepbound.

## 2022-12-01 MED ORDER — SEMAGLUTIDE-WEIGHT MANAGEMENT 1.7 MG/0.75ML ~~LOC~~ SOAJ: 1.7000 mg | SUBCUTANEOUS | 0 refills | Status: DC

## 2022-12-01 NOTE — Telephone Encounter (Signed)
Why is he wanting to switch?  Does his insurance cover [phone]?

## 2022-12-01 NOTE — Telephone Encounter (Signed)
He said he had better results on the semaglutide.  He pays out of pocket for the medication.

## 2022-12-01 NOTE — Addendum Note (Signed)
Addended by: Lorre Munroe on: 12/01/2022 10:52 AM   Modules accepted: Orders

## 2022-12-01 NOTE — Telephone Encounter (Signed)
Wegovy 1.7 mg weekly sent to pharmacy

## 2022-12-02 ENCOUNTER — Ambulatory Visit: Payer: Self-pay

## 2022-12-02 ENCOUNTER — Other Ambulatory Visit: Payer: Self-pay

## 2022-12-02 MED ORDER — SEMAGLUTIDE-WEIGHT MANAGEMENT 1.7 MG/0.75ML ~~LOC~~ SOAJ: 1.7000 mg | SUBCUTANEOUS | 0 refills | Status: DC

## 2022-12-02 NOTE — Addendum Note (Signed)
Addended by: Lorre Munroe on: 12/02/2022 01:48 PM   Modules accepted: Orders

## 2022-12-02 NOTE — Telephone Encounter (Signed)
Chief Complaint: Medication Question   Disposition: [] ED /[] Urgent Care (no appt availability in office) / [] Appointment(In office/virtual)/ []  [place] [facility]/ [] Home Care/ [] Refused Recommended Disposition /[] [place] [facility]/ [x]  Follow-up with PCP Additional Notes: Spoke to [facility] the Pharmacist at [facility] who stated she needed clarification on a Rx she received for the patient. The medication is Semaglutide-Weight Management 1.7 MG/0.75ML SOAJ 3 mL 0 12/03/2022 -Sig - Route: Inject 1.7 mg into the skin 3 (three) times a week She states it is usually only 1 time a week. Clarified that the order should have said 1 time a week instead of 3 times a week. Yvonna Alanis stated that the system canceled the Rx and she would need a new Rx sent to the pharmacy with the correct Sig. Advised I would forward to provider to correct the error.  Summary: Semaglutide-Weight Management 1.7 MG/0.75ML SOAJ 3 mL?   Kaitlyn the pharmacist at [facility] called in needing clarification on a prescription. The medication is Semaglutide-Weight Management 1.7 MG/0.75ML SOAJ 3 mL 0 12/03/2022 - Sig - Route: Inject 1.7 mg into the skin 3 (three) times a week  She states it is usually only 1 time a week. Please assist further     Reason for Disposition  [1] Pharmacy calling with prescription question AND [2] triager unable to answer question  Answer Assessment - Initial Assessment Questions 1. NAME of MEDICINE: "What medicine(s) are you calling about?"     Semaglutide-Weight Management 1.7 MG/0.75ML 2. QUESTION: "What is your question?" (e.g., double dose of medicine, side effect)     Order clarification  3. PRESCRIBER: "Who prescribed the medicine?" Reason: if prescribed by specialist, call should be referred to that group.     Nicki Reaper, NP  Protocols used: Medication Question Call-A-AH

## 2022-12-03 NOTE — Telephone Encounter (Signed)
Your request for prior authorization was denied, but an Electronic Appeal is available for your patient. Complete the questions in the Appeal section at the bottom of this page to pursue the appeal. For assistance, contact our support team at [phone].  Message from plan: Denied. This health benefit plan does not cover the following services, supplies, drugs or charges: Any treatment or regimen, medical or surgical, for the purpose of reducing or controlling the weight of the member, or for the treatment of obesity, except for surgical treatment of morbid obesity, or as specifically covered by this health benefit plan.

## 2023-05-23 ENCOUNTER — Encounter: Payer: Self-pay | Admitting: Internal Medicine

## 2023-05-23 ENCOUNTER — Ambulatory Visit: Payer: Self-pay | Admitting: Internal Medicine

## 2023-05-23 VITALS — BP 124/74 | Ht 70.0 in | Wt 335.5 lb

## 2023-05-23 DIAGNOSIS — G4733 Obstructive sleep apnea (adult) (pediatric): Secondary | ICD-10-CM | POA: Insufficient documentation

## 2023-05-23 DIAGNOSIS — R7303 Prediabetes: Secondary | ICD-10-CM

## 2023-05-23 DIAGNOSIS — I10 Essential (primary) hypertension: Secondary | ICD-10-CM | POA: Diagnosis not present

## 2023-05-23 DIAGNOSIS — I872 Venous insufficiency (chronic) (peripheral): Secondary | ICD-10-CM

## 2023-05-23 MED ORDER — SEMAGLUTIDE-WEIGHT MANAGEMENT 2.4 MG/0.75ML ~~LOC~~ SOAJ: 2.4000 mg | SUBCUTANEOUS | 0 refills | Status: DC

## 2023-05-23 NOTE — Assessment & Plan Note (Signed)
 A1c today Encourage low-carb diet and exercise for weight loss

## 2023-05-23 NOTE — Assessment & Plan Note (Signed)
 Encourage weight loss as this can help reduce sleep apnea symptoms Continue CPAP use

## 2023-05-23 NOTE — Progress Notes (Signed)
 Subjective:    Patient ID: Timothy Beck, male    DOB: October 09, 1980, 43 y.o.   MRN: [id number]  HPI  Patient presents to clinic today for 60-month follow-up of chronic conditions.  CVI: Managed with furosemide  as needed.  There is no echocardiogram on file.  He does not follow with vascular.  HTN: His BP today is 124/74.  He is taking furosemide  as prescribed.  There is no ECG on file.  Prediabetes: His last A1c was 5.7%, 10/2022.  He is not taking any oral diabetic medication at this time.  He does not check his sugars.  OSA: He averages 6 hours of night with the use of his CPAP.  Sleep study from 06/2015 reviewed.  Review of Systems  Past Medical History:  Diagnosis Date   Heartburn     Current Outpatient Medications  Medication Sig Dispense Refill   furosemide  (LASIX ) 40 MG tablet Take 1 tablet (40 mg total) by mouth daily. 90 tablet 1   Insulin  Pen Needle (PEN NEEDLES) 31G X 5 MM MISC 1 Device by Does not apply route daily. 90 each 1   Semaglutide -Weight Management 1.7 MG/0.75ML SOAJ Inject 1.7 mg into the skin once a week. 3 mL 0   No current facility-administered medications for this visit.    No Known Allergies  Family History  Problem Relation Age of Onset   Diabetes Mother    Alcohol abuse Father    Hypertension Daughter    Colon cancer Neg Hx    Prostate cancer Neg Hx     Social History   Socioeconomic History   Marital status: Single    Spouse name: Not on file   Number of children: Not on file   Years of education: Not on file   Highest education level: Associate degree: academic program  Occupational History   Not on file  Tobacco Use   Smoking status: Every Day    Current packs/day: 1.00    Average packs/day: 1 pack/day for 12.0 years (12.0 ttl pk-yrs)    Types: Cigarettes   Smokeless tobacco: Former    Types: Snuff  Vaping Use   Vaping status: Former  Substance and Sexual Activity   Alcohol use: Yes    Alcohol/week: 4.0 standard drinks of  alcohol    Types: 4 Cans of beer per week   Drug use: No   Sexual activity: Not on file  Other Topics Concern   Not on file  Social History Narrative   Not on file   Social Drivers of Health   Financial Resource Strain: Low Risk  (05/19/2023)   Overall Financial Resource Strain (CARDIA)    Difficulty of Paying Living Expenses: Not hard at all  Food Insecurity: No Food Insecurity (05/19/2023)   Hunger Vital Sign    Worried About Running Out of Food in the Last Year: Never true    Ran Out of Food in the Last Year: Never true  Transportation Needs: No Transportation Needs (05/19/2023)   PRAPARE - Administrator, Civil Service (Medical): No    Lack of Transportation (Non-Medical): No  Physical Activity: Sufficiently Active (05/19/2023)   Exercise Vital Sign    Days of Exercise per Week: 5 days    Minutes of Exercise per Session: 40 min  Stress: No Stress Concern Present (05/19/2023)   [employer] of Occupational Health - Occupational Stress Questionnaire    Feeling of Stress : Only a little  Social Connections: Moderately Integrated (05/19/2023)  Social Advertising account executive [NHANES]    Frequency of Communication with Friends and Family: Twice a week    Frequency of Social Gatherings with Friends and Family: Once a week    Attends Religious Services: More than 4 times per year    Active Member of [employer] or Organizations: Yes    Attends Engineer, structural: More than 4 times per year    Marital Status: Never married  Intimate Partner Violence: Not on file     Constitutional: Patient reports weight gain.  Denies fever, malaise, fatigue, headache.  HEENT: Denies eye pain, eye redness, ear pain, ringing in the ears, wax buildup, runny nose, nasal congestion, bloody nose, or sore throat. Respiratory: Denies difficulty breathing, shortness of breath, cough or sputum production.   Cardiovascular: Patient reports swelling in legs.  Denies chest pain,  chest tightness, palpitations or swelling in the hands. Neurological: Denies dizziness, difficulty with memory, difficulty with speech or problems with balance and coordination.  Psych: Denies anxiety, depression, SI/HI.  No other specific complaints in a complete review of systems (except as listed in HPI above).      Objective:   Physical Exam   BP 124/74 (BP Location: Left Arm, Patient Position: Sitting, Cuff Size: Large)   Ht 5\' 10"  (1.778 m)   Wt (!) 335 lb 8 oz (152.2 kg)   BMI 48.14 kg/m   Wt Readings from Last 3 Encounters:  11/25/22 (!) 369 lb 8 oz (167.6 kg)  05/14/21 (!) 404 lb (183.3 kg)  06/05/20 (!) 415 lb 5 oz (188.4 kg)    General: Appears his stated age, obese, in NAD. Skin: Warm, dry and intact.   HEENT: Head: normal shape and size; Eyes: sclera white and EOMs intact;  Cardiovascular: Normal rate and rhythm. S1,S2 noted.  No murmur, rubs or gallops noted.  Trace lumbar disc or Fadi Menter pitting BLE edema noted. Pulmonary/Chest: Normal effort and positive vesicular breath sounds. No respiratory distress. No wheezes, rales or ronchi noted.  Musculoskeletal: No difficulty with gait.  Neurological: Alert and oriented.  Psychiatric: Mood and affect normal. Behavior is normal. Judgment and thought content normal.    BMET    Component Value Date/Time   NA 139 06/05/2020 0938   NA 141 06/26/2015 0907   K 4.8 06/05/2020 0938   CL 103 06/05/2020 0938   CO2 28 06/05/2020 0938   GLUCOSE 117 (H) 06/05/2020 0938   BUN 17 06/05/2020 0938   BUN 15 06/26/2015 0907   CREATININE 0.93 06/05/2020 0938   CALCIUM 9.5 06/05/2020 0938   GFRNONAA 113 04/16/2019 1049   GFRAA 131 04/16/2019 1049    Lipid Panel     Component Value Date/Time   CHOL 150 06/05/2020 0938   CHOL 153 06/26/2015 0907   TRIG 92 06/05/2020 0938   HDL 47 06/05/2020 0938   HDL 45 06/26/2015 0907   CHOLHDL 3.2 06/05/2020 0938   LDLCALC 84 06/05/2020 0938    CBC    Component Value Date/Time    WBC 8.8 04/16/2019 1049   RBC 4.83 04/16/2019 1049   HGB 14.4 04/16/2019 1049   HGB 14.5 06/26/2015 0907   HCT 41.7 04/16/2019 1049   HCT 40.9 06/26/2015 0907   PLT 274 04/16/2019 1049   PLT 236 06/26/2015 0907   MCV 86.3 04/16/2019 1049   MCV 83 06/26/2015 0907   MCH 29.8 04/16/2019 1049   MCHC 34.5 04/16/2019 1049   RDW 14.0 04/16/2019 1049   RDW 13.9 06/26/2015 0907  LYMPHSABS 2,279 04/16/2019 1049   LYMPHSABS 3.0 06/26/2015 0907   MONOABS 0.8 05/29/2017 1608   EOSABS 290 04/16/2019 1049   EOSABS 0.3 06/26/2015 0907   BASOSABS 44 04/16/2019 1049   BASOSABS 0.0 06/26/2015 0907    Hgb A1C Lab Results  Component Value Date   HGBA1C 5.7 (H) 11/25/2022           Assessment & Plan:   RTC in 6 months for your annual exam Helayne Lo, NP

## 2023-05-23 NOTE — Assessment & Plan Note (Signed)
 Continue furosemide  and Reinforced DASH diet and exercise for weight loss C-Met today

## 2023-05-23 NOTE — Assessment & Plan Note (Signed)
 Continue wegovy

## 2023-05-23 NOTE — Patient Instructions (Signed)

## 2023-05-23 NOTE — Assessment & Plan Note (Signed)
 Continue furosemide   Encouraged DASH diet and exercise for weight loss C-Met today

## 2023-05-24 ENCOUNTER — Encounter: Payer: Self-pay | Admitting: Internal Medicine

## 2023-05-24 LAB — COMPREHENSIVE METABOLIC PANEL WITH GFR
AG Ratio: 1.5 (calc) (ref 1.0–2.5)
ALT: 21 U/L (ref 9–46)
AST: 19 U/L (ref 10–40)
Albumin: 4.4 g/dL (ref 3.6–5.1)
Alkaline phosphatase (APISO): 79 U/L (ref 36–130)
BUN: 23 mg/dL (ref 7–25)
CO2: 26 mmol/L (ref 20–32)
Calcium: 9.8 mg/dL (ref 8.6–10.3)
Chloride: 103 mmol/L (ref 98–110)
Creat: 0.9 mg/dL (ref 0.60–1.29)
Globulin: 3 g/dL (ref 1.9–3.7)
Glucose, Bld: 94 mg/dL (ref 65–139)
Potassium: 3.9 mmol/L (ref 3.5–5.3)
Sodium: 139 mmol/L (ref 135–146)
Total Bilirubin: 0.5 mg/dL (ref 0.2–1.2)
Total Protein: 7.4 g/dL (ref 6.1–8.1)
eGFR: 109 mL/min/{1.73_m2} (ref >=60)

## 2023-05-24 LAB — LIPID PANEL
Cholesterol: 162 mg/dL (ref <200)
HDL: 45 mg/dL (ref >=40)
LDL Cholesterol (Calc): 97 mg/dL
Non-HDL Cholesterol (Calc): 117 mg/dL (ref <130)
Total CHOL/HDL Ratio: 3.6 (calc) (ref <5.0)
Triglycerides: 108 mg/dL (ref <150)

## 2023-05-24 LAB — HEMOGLOBIN A1C
Hgb A1c MFr Bld: 5.5 % (ref <5.7)
Mean Plasma Glucose: 111 mg/dL
eAG (mmol/L): 6.2 mmol/L

## 2023-05-24 LAB — CBC
HCT: 45.5 % (ref 38.5–50.0)
Hemoglobin: 14.8 g/dL (ref 13.2–17.1)
MCH: 28.2 pg (ref 27.0–33.0)
MCHC: 32.5 g/dL (ref 32.0–36.0)
MCV: 86.8 fL (ref 80.0–100.0)
MPV: 10.6 fL (ref 7.5–12.5)
Platelets: 313 10*3/uL (ref 140–400)
RBC: 5.24 10*6/uL (ref 4.20–5.80)
RDW: 15.3 % — ABNORMAL HIGH (ref 11.0–15.0)
WBC: 12 10*3/uL — ABNORMAL HIGH (ref 3.8–10.8)

## 2023-07-01 ENCOUNTER — Other Ambulatory Visit: Payer: Self-pay | Admitting: Internal Medicine

## 2023-07-01 NOTE — Telephone Encounter (Signed)
 Requested medications are due for refill today.  yes  Requested medications are on the active medications list.  yes  Last refill. 11/25/2022 #90 1 rf  Future visit scheduled.   yes  Notes to clinic.  Missing lab - magnesium.    Requested Prescriptions  Pending Prescriptions Disp Refills   furosemide  (LASIX ) 40 MG tablet [Pharmacy Med Name: FUROSEMIDE  40MG  TABLETS] 90 tablet 1    Sig: TAKE 1 TABLET(40 MG) BY MOUTH DAILY     Cardiovascular:  Diuretics - Loop Failed - 07/01/2023  3:22 PM      Failed - Mg Level in normal range and within 180 days    No results found for: "MG"       Passed - K in normal range and within 180 days    Potassium  Date Value Ref Range Status  05/23/2023 3.9 3.5 - 5.3 mmol/L Final         Passed - Ca in normal range and within 180 days    Calcium  Date Value Ref Range Status  05/23/2023 9.8 8.6 - 10.3 mg/dL Final         Passed - Na in normal range and within 180 days    Sodium  Date Value Ref Range Status  05/23/2023 139 135 - 146 mmol/L Final  06/26/2015 141 134 - 144 mmol/L Final         Passed - Cr in normal range and within 180 days    Creat  Date Value Ref Range Status  05/23/2023 0.90 0.60 - 1.29 mg/dL Final         Passed - Cl in normal range and within 180 days    Chloride  Date Value Ref Range Status  05/23/2023 103 98 - 110 mmol/L Final         Passed - Last BP in normal range    BP Readings from Last 1 Encounters:  05/23/23 124/74         Passed - Valid encounter within last 6 months    Recent Outpatient Visits           1 month ago Prediabetes   [facility] [place], Rankin Buzzard, NP

## 2023-08-17 ENCOUNTER — Encounter: Payer: Self-pay | Admitting: Internal Medicine

## 2023-08-26 ENCOUNTER — Encounter: Payer: Self-pay | Admitting: Internal Medicine

## 2023-08-26 ENCOUNTER — Ambulatory Visit: Admitting: Internal Medicine

## 2023-08-26 VITALS — BP 120/74 | Ht 70.0 in | Wt 350.0 lb

## 2023-08-26 DIAGNOSIS — R7303 Prediabetes: Secondary | ICD-10-CM | POA: Diagnosis not present

## 2023-08-26 DIAGNOSIS — I872 Venous insufficiency (chronic) (peripheral): Secondary | ICD-10-CM | POA: Diagnosis not present

## 2023-08-26 DIAGNOSIS — G4733 Obstructive sleep apnea (adult) (pediatric): Secondary | ICD-10-CM | POA: Diagnosis not present

## 2023-08-26 DIAGNOSIS — I10 Essential (primary) hypertension: Secondary | ICD-10-CM

## 2023-08-26 MED ORDER — WEGOVY 0.25 MG/0.5ML ~~LOC~~ SOAJ: 0.2500 mg | SUBCUTANEOUS | Status: AC

## 2023-08-26 MED ORDER — ZEPBOUND 2.5 MG/0.5ML ~~LOC~~ SOAJ: 2.5000 mg | SUBCUTANEOUS | 0 refills | Status: AC

## 2023-08-26 NOTE — Progress Notes (Signed)
 Subjective:    Patient ID: Timothy Beck, male    DOB: [birth year], 43 y.o.   MRN: [id number]  HPI  Discussed the use of AI scribe software for clinical note transcription with the patient, who gave verbal consent to proceed.  Timothy Beck is a 43 year old male with obesity and sleep apnea who presents for discussion about weight management options.  He has been off semaglutide  for approximately 12 to 13 weeks due to changes in availability and cost, which he finds prohibitive at approximately $750 per month. Previously, he received the medication through direct primary care.  In 2022, he weighed 415 pounds with a BMI of 62.96, and his weight increased to 430 pounds at its peak. With the use of semaglutide , he reduced his weight to 335.5 pounds with a BMI of 48.14 by April 2025. Since discontinuing the medication, he has gained 15 pounds, bringing his current weight to 350.5 pounds with a BMI of 50.22. He notes fluctuations within a 10-pound range.  He reports that he had severe sleep apnea per his sleep study and is 100% dependent on a CPAP machine, which he continues to use regularly.   He maintains a consistent exercise routine, going to the gym daily, and has noticed a significant change in his eating habits compared to before starting semaglutide . He has not been on any other weight management medications like phentermine or qsymia in the past.       Review of Systems  Past Medical History:  Diagnosis Date   Heartburn     Current Outpatient Medications  Medication Sig Dispense Refill   furosemide  (LASIX ) 40 MG tablet TAKE 1 TABLET(40 MG) BY MOUTH DAILY 90 tablet 1   Insulin  Pen Needle (PEN NEEDLES) 31G X 5 MM MISC 1 Device by Does not apply route daily. 90 each 1   Semaglutide -Weight Management 2.4 MG/0.75ML SOAJ Inject 2.4 mg into the skin once a week. 9 mL 0   No current facility-administered medications for this visit.    No Known Allergies  Family History  Problem  Relation Age of Onset   Diabetes Mother    Alcohol abuse Father    Hypertension Daughter    Colon cancer Neg Hx    Prostate cancer Neg Hx     Social History   Socioeconomic History   Marital status: Single    Spouse name: Not on file   Number of children: Not on file   Years of education: Not on file   Highest education level: Associate degree: academic program  Occupational History   Not on file  Tobacco Use   Smoking status: Every Day    Current packs/day: 1.00    Average packs/day: 1 pack/day for 12.0 years (12.0 ttl pk-yrs)    Types: Cigarettes   Smokeless tobacco: Former    Types: Snuff  Vaping Use   Vaping status: Former  Substance and Sexual Activity   Alcohol use: Yes    Alcohol/week: 4.0 standard drinks of alcohol    Types: 4 Cans of beer per week   Drug use: No   Sexual activity: Not on file  Other Topics Concern   Not on file  Social History Narrative   Not on file   Social Drivers of Health   Financial Resource Strain: Low Risk  (08/23/2023)   Overall Financial Resource Strain (CARDIA)    Difficulty of Paying Living Expenses: Not hard at all  Food Insecurity: No Food Insecurity (08/23/2023)   Hunger Vital  Sign    Worried About Programme researcher, broadcasting/film/video in the Last Year: Never true    Ran Out of Food in the Last Year: Never true  Transportation Needs: No Transportation Needs (08/23/2023)   PRAPARE - Administrator, Civil Service (Medical): No    Lack of Transportation (Non-Medical): No  Physical Activity: Sufficiently Active (08/23/2023)   Exercise Vital Sign    Days of Exercise per Week: 5 days    Minutes of Exercise per Session: 40 min  Stress: No Stress Concern Present (08/23/2023)   [employer] of Occupational Health - Occupational Stress Questionnaire    Feeling of Stress: Only a little  Social Connections: Moderately Integrated (08/23/2023)   Social Connection and Isolation Panel    Frequency of Communication with Friends and Family:  Twice a week    Frequency of Social Gatherings with Friends and Family: Once a week    Attends Religious Services: More than 4 times per year    Active Member of [employer] or Organizations: Yes    Attends Engineer, structural: More than 4 times per year    Marital Status: Never married  Intimate Partner Violence: Not on file     Constitutional: Patient reports weight gain.  Denies fever, malaise, fatigue, headache.  HEENT: Denies eye pain, eye redness, ear pain, ringing in the ears, wax buildup, runny nose, nasal congestion, bloody nose, or sore throat. Respiratory: Denies difficulty breathing, shortness of breath, cough or sputum production.   Cardiovascular: Patient reports swelling in legs.  Denies chest pain, chest tightness, palpitations or swelling in the hands. Neurological: Denies dizziness, difficulty with memory, difficulty with speech or problems with balance and coordination.  Psych: Denies anxiety, depression, SI/HI.  No other specific complaints in a complete review of systems (except as listed in HPI above).      Objective:   Physical Exam  BP 120/74 (BP Location: Right Arm, Patient Position: Sitting, Cuff Size: Large)   Ht 5' 10 (1.778 m)   Wt (!) 350 lb (158.8 kg)   BMI 50.22 kg/m    Wt Readings from Last 3 Encounters:  05/23/23 (!) 335 lb 8 oz (152.2 kg)  11/25/22 (!) 369 lb 8 oz (167.6 kg)  05/14/21 (!) 404 lb (183.3 kg)    General: Appears his stated age, obese, in NAD. Skin: Warm, dry and intact.   Cardiovascular: Normal rate. Pulmonary/Chest: Normal effort and positive vesicular breath sounds. No respiratory distress. No wheezes, rales or ronchi noted.  Musculoskeletal: No difficulty with gait.  Neurological: Alert and oriented.    BMET    Component Value Date/Time   NA 139 05/23/2023 1549   NA 141 06/26/2015 0907   K 3.9 05/23/2023 1549   CL 103 05/23/2023 1549   CO2 26 05/23/2023 1549   GLUCOSE 94 05/23/2023 1549   BUN 23 05/23/2023  1549   BUN 15 06/26/2015 0907   CREATININE 0.90 05/23/2023 1549   CALCIUM 9.8 05/23/2023 1549   GFRNONAA 113 04/16/2019 1049   GFRAA 131 04/16/2019 1049    Lipid Panel     Component Value Date/Time   CHOL 162 05/23/2023 1549   CHOL 153 06/26/2015 0907   TRIG 108 05/23/2023 1549   HDL 45 05/23/2023 1549   HDL 45 06/26/2015 0907   CHOLHDL 3.6 05/23/2023 1549   LDLCALC 97 05/23/2023 1549    CBC    Component Value Date/Time   WBC 12.0 (H) 05/23/2023 1549   RBC 5.24 05/23/2023 1549  HGB 14.8 05/23/2023 1549   HGB 14.5 06/26/2015 0907   HCT 45.5 05/23/2023 1549   HCT 40.9 06/26/2015 0907   PLT 313 05/23/2023 1549   PLT 236 06/26/2015 0907   MCV 86.8 05/23/2023 1549   MCV 83 06/26/2015 0907   MCH 28.2 05/23/2023 1549   MCHC 32.5 05/23/2023 1549   RDW 15.3 (H) 05/23/2023 1549   RDW 13.9 06/26/2015 0907   LYMPHSABS 2,279 04/16/2019 1049   LYMPHSABS 3.0 06/26/2015 0907   MONOABS 0.8 05/29/2017 1608   EOSABS 290 04/16/2019 1049   EOSABS 0.3 06/26/2015 0907   BASOSABS 44 04/16/2019 1049   BASOSABS 0.0 06/26/2015 0907    Hgb A1C Lab Results  Component Value Date   HGBA1C 5.5 05/23/2023           Assessment & Plan:   Assessment and Plan    Obesity Obesity with significant weight loss on semaglutide , regained 15 pounds post-discontinuation. Current BMI 50.22. Actively engaged in weight management. - Attempt insurance approval for zepbound  for sleep apnea. - Consider semaglutide  from [employer] if Zepbound  not approved. - Discuss Qsymia as cost-effective alternative. - Provide semaglutide  0.25 mg starter pack if available.  Obstructive sleep apnea, CVI, prediabetes, hypertension Moderate obstructive sleep apnea, 100% CPAP dependent. Last A1C was WNL 04/2023- secondary to weight loss with semaglutide . HTN and venous insufficiency controlled on furosemide . - Submit insurance request for zepbound  based on sleep apnea diagnosis.      RTC in 2 months for  your annual exam Angeline Laura, NP

## 2023-08-26 NOTE — Patient Instructions (Signed)

## 2023-08-29 ENCOUNTER — Telehealth: Payer: Self-pay

## 2023-08-29 ENCOUNTER — Other Ambulatory Visit (HOSPITAL_COMMUNITY): Payer: Self-pay

## 2023-08-29 NOTE — Telephone Encounter (Signed)
 Pharmacy Patient Advocate Encounter  Received notification from [facility] that Prior Authorization for  Zepbound  2.5MG /0.5ML pen-injectors  has been DENIED.  Full denial letter will be uploaded to the media tab. See denial reason below.   PA #/Case ID/Reference #: [id number]

## 2023-08-29 NOTE — Telephone Encounter (Signed)
 Pharmacy Patient Advocate Encounter   Received notification from CoverMyMeds that prior authorization for Zepbound  2.5MG /0.5ML pen-injectors is required/requested.   Insurance verification completed.   The patient is insured through [facility] .   Per test claim: PA required; PA submitted to above mentioned insurance via CoverMyMeds Key/confirmation #/EOC [id number] Status is pending

## 2023-12-12 ENCOUNTER — Encounter: Admitting: Internal Medicine

## 2024-02-09 ENCOUNTER — Encounter: Admitting: Internal Medicine

## 2024-02-09 NOTE — Progress Notes (Unsigned)
 "  Subjective:    Patient ID: Timothy Beck, male    DOB: May 19, 1980, 44 y.o.   MRN: [id number]  HPI  Patient presents to clinic today for his annual exam.  Flu: 11/2015 Tetanus: < 10 years ago COVID: X 1 Vision screening:  annually Dentist: biannually  Diet: He does eat meat. He consumes fruits and veggies. He tries to avoid fried foods. He drinks mostly water, juice, beer Exercise: Circuit training 4-5 times per week  Review of Systems     Past Medical History:  Diagnosis Date   Heartburn     Current Outpatient Medications  Medication Sig Dispense Refill   furosemide  (LASIX ) 40 MG tablet TAKE 1 TABLET(40 MG) BY MOUTH DAILY 90 tablet 1   Multiple Vitamin (MULTIVITAMIN WITH MINERALS) TABS tablet Take 1 tablet by mouth daily.     Semaglutide -Weight Management (WEGOVY ) 0.25 MG/0.5ML SOAJ Inject 0.25 mg into the skin once a week.     tirzepatide  (ZEPBOUND ) 2.5 MG/0.5ML Pen Inject 2.5 mg into the skin once a week. 2 mL 0   No current facility-administered medications for this visit.    No Known Allergies  Family History  Problem Relation Age of Onset   Diabetes Mother    Alcohol abuse Father    Hypertension Daughter    Colon cancer Neg Hx    Prostate cancer Neg Hx     Social History   Socioeconomic History   Marital status: Single    Spouse name: Not on file   Number of children: Not on file   Years of education: Not on file   Highest education level: Associate degree: academic program  Occupational History   Not on file  Tobacco Use   Smoking status: Every Day    Current packs/day: 1.00    Average packs/day: 1 pack/day for 12.0 years (12.0 ttl pk-yrs)    Types: Cigarettes   Smokeless tobacco: Former    Types: Snuff  Vaping Use   Vaping status: Former  Substance and Sexual Activity   Alcohol use: Yes    Alcohol/week: 4.0 standard drinks of alcohol    Types: 4 Cans of beer per week   Drug use: No   Sexual activity: Not on file  Other Topics Concern    Not on file  Social History Narrative   Not on file   Social Drivers of Health   Tobacco Use: High Risk (08/26/2023)   Patient History    Smoking Tobacco Use: Every Day    Smokeless Tobacco Use: Former    Passive Exposure: Not on Actuary Strain: Low Risk (08/23/2023)   Overall Financial Resource Strain (CARDIA)    Difficulty of Paying Living Expenses: Not hard at all  Food Insecurity: No Food Insecurity (08/23/2023)   Epic    Worried About Radiation Protection Practitioner of Food in the Last Year: Never true    Ran Out of Food in the Last Year: Never true  Transportation Needs: No Transportation Needs (08/23/2023)   Epic    Lack of Transportation (Medical): No    Lack of Transportation (Non-Medical): No  Physical Activity: Sufficiently Active (08/23/2023)   Exercise Vital Sign    Days of Exercise per Week: 5 days    Minutes of Exercise per Session: 40 min  Stress: No Stress Concern Present (08/23/2023)   [employer] of Occupational Health - Occupational Stress Questionnaire    Feeling of Stress: Only a little  Social Connections: Moderately Integrated (08/23/2023)   Social Connection  and Isolation Panel    Frequency of Communication with Friends and Family: Twice a week    Frequency of Social Gatherings with Friends and Family: Once a week    Attends Religious Services: More than 4 times per year    Active Member of Clubs or Organizations: Yes    Attends Banker Meetings: More than 4 times per year    Marital Status: Never married  Intimate Partner Violence: Not on file  Depression (PHQ2-9): Low Risk (08/26/2023)   Depression (PHQ2-9)    PHQ-2 Score: 0  Alcohol Screen: Low Risk (08/23/2023)   Alcohol Screen    Last Alcohol Screening Score (AUDIT): 5  Housing: Unknown (08/23/2023)   Epic    Unable to Pay for Housing in the Last Year: No    Number of Times Moved in the Last Year: Not on file    Homeless in the Last Year: No  Utilities: Not on file  Health Literacy:  Not on file     Constitutional: Denies fever, malaise, fatigue, headache or abrupt weight changes.  HEENT: Denies eye pain, eye redness, ear pain, ringing in the ears, wax buildup, runny nose, nasal congestion, bloody nose, or sore throat. Respiratory: Denies difficulty breathing, shortness of breath, cough or sputum production.   Cardiovascular: Patient reports intermittent swelling in legs.  Denies chest pain, chest tightness, palpitations or swelling in the hands.  Gastrointestinal: Denies abdominal pain, bloating, constipation, diarrhea or blood in the stool.  GU: Denies urgency, frequency, pain with urination, burning sensation, blood in urine, odor or discharge. Musculoskeletal: Denies decrease in range of motion, difficulty with gait, muscle pain or joint pain and swelling.  Skin: Denies redness, rashes, lesions or ulcercations.  Neurological: Denies dizziness, difficulty with memory, difficulty with speech or problems with balance and coordination.  Psych: Denies anxiety, depression, SI/HI.  No other specific complaints in a complete review of systems (except as listed in HPI above).  Objective:   Physical Exam  There were no vitals taken for this visit.  Wt Readings from Last 3 Encounters:  08/26/23 (!) 350 lb (158.8 kg)  05/23/23 (!) 335 lb 8 oz (152.2 kg)  11/25/22 (!) 369 lb 8 oz (167.6 kg)    General: Appears his stated age, obese, in NAD. Skin: Warm, dry and intact.  HEENT: Head: normal shape and size; Eyes: sclera white, no icterus, conjunctiva pink, PERRLA and EOMs intact;  Neck:  Neck supple, trachea midline. No masses, lumps or thyromegaly present.  Cardiovascular: Normal rate and rhythm. S1,S2 noted.  No murmur, rubs or gallops noted. No JVD. Trace pitting BLE edema.  Pulmonary/Chest: Normal effort and positive vesicular breath sounds. No respiratory distress. No wheezes, rales or ronchi noted.  Abdomen: Soft and nontender. Normal bowel sounds.  Musculoskeletal:  Strength 5/5 BUE/BLE. No difficulty with gait.  Neurological: Alert and oriented. Cranial nerves II-XII grossly intact. Coordination normal.  Psychiatric: Mood and affect normal. Behavior is normal. Judgment and thought content normal.    BMET    Component Value Date/Time   NA 139 05/23/2023 1549   NA 141 06/26/2015 0907   K 3.9 05/23/2023 1549   CL 103 05/23/2023 1549   CO2 26 05/23/2023 1549   GLUCOSE 94 05/23/2023 1549   BUN 23 05/23/2023 1549   BUN 15 06/26/2015 0907   CREATININE 0.90 05/23/2023 1549   CALCIUM 9.8 05/23/2023 1549   GFRNONAA 113 04/16/2019 1049   GFRAA 131 04/16/2019 1049    Lipid Panel  Component Value Date/Time   CHOL 162 05/23/2023 1549   CHOL 153 06/26/2015 0907   TRIG 108 05/23/2023 1549   HDL 45 05/23/2023 1549   HDL 45 06/26/2015 0907   CHOLHDL 3.6 05/23/2023 1549   LDLCALC 97 05/23/2023 1549    CBC    Component Value Date/Time   WBC 12.0 (H) 05/23/2023 1549   RBC 5.24 05/23/2023 1549   HGB 14.8 05/23/2023 1549   HGB 14.5 06/26/2015 0907   HCT 45.5 05/23/2023 1549   HCT 40.9 06/26/2015 0907   PLT 313 05/23/2023 1549   PLT 236 06/26/2015 0907   MCV 86.8 05/23/2023 1549   MCV 83 06/26/2015 0907   MCH 28.2 05/23/2023 1549   MCHC 32.5 05/23/2023 1549   RDW 15.3 (H) 05/23/2023 1549   RDW 13.9 06/26/2015 0907   LYMPHSABS 2,279 04/16/2019 1049   LYMPHSABS 3.0 06/26/2015 0907   MONOABS 0.8 05/29/2017 1608   EOSABS 290 04/16/2019 1049   EOSABS 0.3 06/26/2015 0907   BASOSABS 44 04/16/2019 1049   BASOSABS 0.0 06/26/2015 0907    Hgb A1C Lab Results  Component Value Date   HGBA1C 5.5 05/23/2023            Assessment & Plan:   Preventative health maintenance:  Flu shot declined Tetanus UTD per his report Encouraged him to get his COVID booster Encouraged him to consume a balanced diet and exercise regimen Advised him to see an eye doctor and dentist annually Will check CBC, c-Met, lipid, A1c and hep C today  RTC in 6  months, follow-up chronic conditions Angeline Laura, NP  "

## 2024-02-13 ENCOUNTER — Other Ambulatory Visit: Payer: Self-pay | Admitting: Internal Medicine

## 2024-02-13 ENCOUNTER — Encounter: Payer: Self-pay | Admitting: Internal Medicine

## 2024-02-14 NOTE — Telephone Encounter (Signed)
 Requested medication (s) are due for refill today: yes   Requested medication (s) are on the active medication list: yes   Last refill:  07/01/23 #90 1 refills  Future visit scheduled: yes 03/09/24  Notes to clinic:  protocol failed last labs 05/23/23 . Do you want to refill Rx?     Requested Prescriptions  Pending Prescriptions Disp Refills   furosemide  (LASIX ) 40 MG tablet [Pharmacy Med Name: FUROSEMIDE  40MG  TABLETS] 90 tablet 1    Sig: TAKE 1 TABLET(40 MG) BY MOUTH DAILY     Cardiovascular:  Diuretics - Loop Failed - 02/14/2024  8:04 AM      Failed - K in normal range and within 180 days    Potassium  Date Value Ref Range Status  05/23/2023 3.9 3.5 - 5.3 mmol/L Final         Failed - Ca in normal range and within 180 days    Calcium  Date Value Ref Range Status  05/23/2023 9.8 8.6 - 10.3 mg/dL Final         Failed - Na in normal range and within 180 days    Sodium  Date Value Ref Range Status  05/23/2023 139 135 - 146 mmol/L Final  06/26/2015 141 134 - 144 mmol/L Final         Failed - Cr in normal range and within 180 days    Creat  Date Value Ref Range Status  05/23/2023 0.90 0.60 - 1.29 mg/dL Final         Failed - Cl in normal range and within 180 days    Chloride  Date Value Ref Range Status  05/23/2023 103 98 - 110 mmol/L Final         Failed - Mg Level in normal range and within 180 days    No results found for: MG       Passed - Last BP in normal range    BP Readings from Last 1 Encounters:  08/26/23 120/74         Passed - Valid encounter within last 6 months    Recent Outpatient Visits           5 months ago OSA (obstructive sleep apnea)   [facility] [place] [place], Angeline ORN, NP   8 months ago Prediabetes   [facility] [place], Angeline ORN, NP

## 2024-03-09 ENCOUNTER — Encounter: Admitting: Internal Medicine
